# Patient Record
Sex: Female | Born: 1935 | Race: White | Hispanic: No | Smoking: Never smoker
Health system: Southern US, Community
[De-identification: ages and names within clinical notes are randomized; demographics above are authoritative.]

## PROBLEM LIST (undated history)

## (undated) DIAGNOSIS — E079 Disorder of thyroid, unspecified: Secondary | ICD-10-CM

## (undated) DIAGNOSIS — F0390 Unspecified dementia without behavioral disturbance: Secondary | ICD-10-CM

---

## 2015-12-24 ENCOUNTER — Encounter (HOSPITAL_BASED_OUTPATIENT_CLINIC_OR_DEPARTMENT_OTHER): Payer: Self-pay | Admitting: Emergency Medicine

## 2015-12-24 ENCOUNTER — Inpatient Hospital Stay (HOSPITAL_BASED_OUTPATIENT_CLINIC_OR_DEPARTMENT_OTHER)
Admission: EM | Admit: 2015-12-24 | Discharge: 2015-12-27 | DRG: 200 | Disposition: A | Discharge status: Home Health | Payer: Medicare Other | Attending: Surgery | Admitting: Surgery

## 2015-12-24 ENCOUNTER — Emergency Department (HOSPITAL_BASED_OUTPATIENT_CLINIC_OR_DEPARTMENT_OTHER): Payer: Medicare Other

## 2015-12-24 DIAGNOSIS — E079 Disorder of thyroid, unspecified: Secondary | ICD-10-CM | POA: Diagnosis present

## 2015-12-24 DIAGNOSIS — J939 Pneumothorax, unspecified: Secondary | ICD-10-CM

## 2015-12-24 DIAGNOSIS — J90 Pleural effusion, not elsewhere classified: Secondary | ICD-10-CM | POA: Diagnosis present

## 2015-12-24 DIAGNOSIS — J9811 Atelectasis: Secondary | ICD-10-CM | POA: Diagnosis present

## 2015-12-24 DIAGNOSIS — S2249XA Multiple fractures of ribs, unspecified side, initial encounter for closed fracture: Secondary | ICD-10-CM | POA: Diagnosis present

## 2015-12-24 DIAGNOSIS — J948 Other specified pleural conditions: Secondary | ICD-10-CM

## 2015-12-24 DIAGNOSIS — S272XXA Traumatic hemopneumothorax, initial encounter: Principal | ICD-10-CM

## 2015-12-24 DIAGNOSIS — S2242XA Multiple fractures of ribs, left side, initial encounter for closed fracture: Secondary | ICD-10-CM

## 2015-12-24 DIAGNOSIS — F0390 Unspecified dementia without behavioral disturbance: Secondary | ICD-10-CM | POA: Diagnosis present

## 2015-12-24 DIAGNOSIS — R079 Chest pain, unspecified: Secondary | ICD-10-CM | POA: Diagnosis present

## 2015-12-24 DIAGNOSIS — W19XXXA Unspecified fall, initial encounter: Secondary | ICD-10-CM

## 2015-12-24 HISTORY — DX: Disorder of thyroid, unspecified: E07.9

## 2015-12-24 HISTORY — DX: Unspecified dementia, unspecified severity, without behavioral disturbance, psychotic disturbance, mood disturbance, and anxiety: F03.90

## 2015-12-24 LAB — CBC WITH DIFFERENTIAL/PLATELET
Basophils Absolute: 0.1 10*3/uL (ref 0.0–0.1)
Basophils Relative: 1 %
EOS ABS: 0.5 10*3/uL (ref 0.0–0.7)
EOS PCT: 4 %
HCT: 35 % — ABNORMAL LOW (ref 36.0–46.0)
Hemoglobin: 12 g/dL (ref 12.0–15.0)
LYMPHS ABS: 2.7 10*3/uL (ref 0.7–4.0)
LYMPHS PCT: 25 %
MCH: 29.4 pg (ref 26.0–34.0)
MCHC: 34.3 g/dL (ref 30.0–36.0)
MCV: 85.8 fL (ref 78.0–100.0)
MONOS PCT: 12 %
Monocytes Absolute: 1.3 10*3/uL — ABNORMAL HIGH (ref 0.1–1.0)
Neutro Abs: 6.1 10*3/uL (ref 1.7–7.7)
Neutrophils Relative %: 58 %
PLATELETS: 229 10*3/uL (ref 150–400)
RBC: 4.08 MIL/uL (ref 3.87–5.11)
RDW: 13.1 % (ref 11.5–15.5)
WBC: 10.6 10*3/uL — AB (ref 4.0–10.5)

## 2015-12-24 LAB — COMPREHENSIVE METABOLIC PANEL
ALK PHOS: 49 U/L (ref 38–126)
ALT: 19 U/L (ref 14–54)
ANION GAP: 9 (ref 5–15)
AST: 31 U/L (ref 15–41)
Albumin: 3.2 g/dL — ABNORMAL LOW (ref 3.5–5.0)
BUN: 25 mg/dL — ABNORMAL HIGH (ref 6–20)
CALCIUM: 8.5 mg/dL — AB (ref 8.9–10.3)
CHLORIDE: 96 mmol/L — AB (ref 101–111)
CO2: 25 mmol/L (ref 22–32)
CREATININE: 0.96 mg/dL (ref 0.44–1.00)
GFR, EST AFRICAN AMERICAN: 56 mL/min — AB (ref >60)
GFR, EST NON AFRICAN AMERICAN: 48 mL/min — AB (ref >60)
Glucose, Bld: 92 mg/dL (ref 65–99)
Potassium: 4.1 mmol/L (ref 3.5–5.1)
Sodium: 130 mmol/L — ABNORMAL LOW (ref 135–145)
Total Bilirubin: 0.5 mg/dL (ref 0.3–1.2)
Total Protein: 6.1 g/dL — ABNORMAL LOW (ref 6.5–8.1)

## 2015-12-24 LAB — URINALYSIS, ROUTINE W REFLEX MICROSCOPIC
BILIRUBIN URINE: NEGATIVE
Glucose, UA: NEGATIVE mg/dL
Hgb urine dipstick: NEGATIVE
KETONES UR: NEGATIVE mg/dL
LEUKOCYTES UA: NEGATIVE
NITRITE: NEGATIVE
Protein, ur: NEGATIVE mg/dL
Specific Gravity, Urine: >1.046 — ABNORMAL HIGH (ref 1.005–1.030)
pH: 5.5 (ref 5.0–8.0)

## 2015-12-24 MED ORDER — HYDROCODONE-ACETAMINOPHEN 5-325 MG PO TABS: 0.5000 | ORAL_TABLET | ORAL | Status: DC | PRN

## 2015-12-24 MED ORDER — MORPHINE SULFATE (PF) 2 MG/ML IV SOLN: 1.0000 mg | INTRAVENOUS | Status: DC | PRN

## 2015-12-24 MED ORDER — HYDROCODONE-ACETAMINOPHEN 5-325 MG PO TABS: 2.0000 | ORAL_TABLET | ORAL | Status: DC | PRN

## 2015-12-24 MED ORDER — IOPAMIDOL (ISOVUE-300) INJECTION 61%
80.0000 mL | Freq: Once | INTRAVENOUS | Status: AC | PRN
  Administered 2015-12-24: 100 mL via INTRAVENOUS

## 2015-12-24 MED ORDER — SODIUM CHLORIDE 0.9 % IV SOLN
INTRAVENOUS | Status: DC
  Administered 2015-12-24: 23:00:00 via INTRAVENOUS

## 2015-12-24 MED ORDER — MORPHINE SULFATE (PF) 4 MG/ML IV SOLN: 3.0000 mg | INTRAVENOUS | Status: DC | PRN

## 2015-12-24 MED ORDER — MORPHINE SULFATE (PF) 2 MG/ML IV SOLN: 0.5000 mg | INTRAVENOUS | Status: DC | PRN

## 2015-12-24 MED ORDER — ACETAMINOPHEN 500 MG PO TABS
1000.0000 mg | ORAL_TABLET | Freq: Once | ORAL | Status: AC
  Administered 2015-12-24: 1000 mg via ORAL
  Filled 2015-12-24: qty 2

## 2015-12-24 MED ORDER — FENTANYL CITRATE (PF) 100 MCG/2ML IJ SOLN
50.0000 ug | Freq: Once | INTRAMUSCULAR | Status: AC
  Administered 2015-12-24: 50 ug via INTRAVENOUS
  Filled 2015-12-24: qty 2

## 2015-12-24 MED ORDER — ONDANSETRON HCL 4 MG/2ML IJ SOLN: 4.0000 mg | Freq: Four times a day (QID) | INTRAMUSCULAR | Status: DC | PRN

## 2015-12-24 MED ORDER — HYDROCODONE-ACETAMINOPHEN 5-325 MG PO TABS
1.0000 | ORAL_TABLET | ORAL | Status: DC | PRN
  Administered 2015-12-25: 1 via ORAL
  Filled 2015-12-24: qty 1

## 2015-12-24 MED ORDER — ENOXAPARIN SODIUM 30 MG/0.3ML ~~LOC~~ SOLN: 30.0000 mg | SUBCUTANEOUS | Status: DC

## 2015-12-24 MED ORDER — ONDANSETRON HCL 4 MG/2ML IJ SOLN
4.0000 mg | Freq: Once | INTRAMUSCULAR | Status: AC
  Administered 2015-12-24: 4 mg via INTRAVENOUS
  Filled 2015-12-24: qty 2

## 2015-12-24 MED ORDER — MORPHINE SULFATE (PF) 2 MG/ML IV SOLN
2.0000 mg | INTRAVENOUS | Status: DC | PRN
  Administered 2015-12-24 – 2015-12-25 (×3): 2 mg via INTRAVENOUS
  Filled 2015-12-24 (×3): qty 1

## 2015-12-24 MED ORDER — ONDANSETRON HCL 4 MG PO TABS: 4.0000 mg | ORAL_TABLET | Freq: Four times a day (QID) | ORAL | Status: DC | PRN

## 2015-12-24 NOTE — H&P (Signed)
History   Ashlee Gomez is an 82 y.o. female.   Chief Complaint:  Chief Complaint  Patient presents with  . Fall    Fall  Associated symptoms include chest pain. Pertinent negatives include no abdominal pain, chills, congestion, coughing, diaphoresis, fever, nausea, neck pain or vomiting.  This is a 82 year old female transferred from [facility]. She presented there with chest pain. She apparently fell approximate 3 days ago.  She does not room following. She lives alone and may have some mild dementia. Her son is with her currently. She denies shortness of breath. She reports that her pain is left-sided and toward the back and shoulder. She denies fever or chills. She denies abdominal pain. She was transferred here after a chest x-ray and CT scan showed a left-sided pneumothorax with multiple rib fractures  Past Medical History:  Diagnosis Date  . Dementia   . Thyroid disease     History reviewed. No pertinent surgical history.  History reviewed. No pertinent family history. Social History:  reports that she has never smoked. She has never used smokeless tobacco. She reports that she does not drink alcohol or use drugs.  Allergies  No Known Allergies  Home Medications   (Not in a hospital admission)  Trauma Course   Results for orders placed or performed during the hospital encounter of 12/24/15 (from the past 48 hour(s))  CBC with Differential     Status: Abnormal   Collection Time: 12/24/15  5:50 PM  Result Value Ref Range   WBC 10.6 (H) 4.0 - 10.5 K/uL   RBC 4.08 3.87 - 5.11 MIL/uL   Hemoglobin 12.0 12.0 - 15.0 g/dL   HCT 35.0 (L) 36.0 - 46.0 %   MCV 85.8 78.0 - 100.0 fL   MCH 29.4 26.0 - 34.0 pg   MCHC 34.3 30.0 - 36.0 g/dL   RDW 13.1 11.5 - 15.5 %   Platelets 229 150 - 400 K/uL   Neutrophils Relative % 58 %   Neutro Abs 6.1 1.7 - 7.7 K/uL   Lymphocytes Relative 25 %   Lymphs Abs 2.7 0.7 - 4.0 K/uL   Monocytes Relative 12 %   Monocytes Absolute 1.3  (H) 0.1 - 1.0 K/uL   Eosinophils Relative 4 %   Eosinophils Absolute 0.5 0.0 - 0.7 K/uL   Basophils Relative 1 %   Basophils Absolute 0.1 0.0 - 0.1 K/uL  Comprehensive metabolic panel     Status: Abnormal   Collection Time: 12/24/15  5:50 PM  Result Value Ref Range   Sodium 130 (L) 135 - 145 mmol/L   Potassium 4.1 3.5 - 5.1 mmol/L   Chloride 96 (L) 101 - 111 mmol/L   CO2 25 22 - 32 mmol/L   Glucose, Bld 92 65 - 99 mg/dL   BUN 25 (H) 6 - 20 mg/dL   Creatinine, Ser 0.96 0.44 - 1.00 mg/dL   Calcium 8.5 (L) 8.9 - 10.3 mg/dL   Total Protein 6.1 (L) 6.5 - 8.1 g/dL   Albumin 3.2 (L) 3.5 - 5.0 g/dL   AST 31 15 - 41 U/L   ALT 19 14 - 54 U/L   Alkaline Phosphatase 49 38 - 126 U/L   Total Bilirubin 0.5 0.3 - 1.2 mg/dL   GFR calc non Af Amer 48 (L) >60 mL/min   GFR calc Af Amer 56 (L) >60 mL/min    Comment: (NOTE) The eGFR has been calculated using the CKD EPI equation. This calculation has not been validated  in all clinical situations. eGFR's persistently <60 mL/min signify possible Chronic Kidney Disease.    Anion gap 9 5 - 15   Dg Ribs Unilateral W/chest Left  Result Date: 12/24/2015 CLINICAL DATA:  Fall with left rib pain and shortness of breath for 3 days. EXAM: LEFT RIBS AND CHEST - 3+ VIEW COMPARISON:  None. FINDINGS: Acute fractures of the left posterior fifth, sixth, seventh, eighth and ninth ribs noted. There is a left hydro pneumothorax noted, approximately 15% pneumothorax component atypically and a small to moderate hydropneumothorax component at the base. Left basilar atelectasis is noted. The right lung is clear. Cardiomediastinal silhouette is within normal limits. IMPRESSION: Small to moderate left hydropneumothorax caused by acute fractures of the left fifth through ninth ribs. Associated left basilar atelectasis. Critical Value/emergent results were called by telephone at the time of interpretation on 12/24/2015 at 5:17 pm to Domenic Moras, PA, , who verbally acknowledged these  results. Electronically Signed   By: Margarette Canada M.D.   On: 12/24/2015 17:17   Ct Head Wo Contrast  Result Date: 12/24/2015 CLINICAL DATA:  Fall 5 days ago.  History of dementia. EXAM: CT HEAD WITHOUT CONTRAST CT CERVICAL SPINE WITHOUT CONTRAST TECHNIQUE: Multidetector CT imaging of the head and cervical spine was performed following the standard protocol without intravenous contrast. Multiplanar CT image reconstructions of the cervical spine were also generated. COMPARISON:  None. FINDINGS: CT HEAD FINDINGS Brain: No subdural, epidural, or subarachnoid hemorrhage. Cerebellum, brainstem, and basal cisterns are normal. Ventricles and sulci are prominent but normal for age. Scattered white matter changes are identified. No acute cortical ischemia or infarct. No mass, mass effect, or midline shift. Vascular: Calcified atherosclerosis in the intracranial carotid arteries. Skull: Normal. Negative for fracture or focal lesion. Sinuses/Orbits: No acute finding. Other: None. CT CERVICAL SPINE FINDINGS Alignment: Minimal anterior listhesis of C6 versus C7, likely degenerative, with no associated fracture. No traumatic malalignment identified. Skull base and vertebrae: The anterior arch of C1 is not completely fused but the edges are well corticated and this is consistent with previous trauma or a developmental variant. No adjacent soft tissue swelling. There is a lucency through the right pedicle of C1 which is well corticated, also chronic in appearance. This is consistent with incomplete fusion versus remote history of trauma. No acute fractures are seen. Soft tissues and spinal canal: No prevertebral fluid or swelling. No visible canal hematoma. Disc levels: Multilevel degenerative changes with anterior osteophytes. No significant canal narrowing. Facet degenerative changes are noted. Upper chest: There is a hydro pneumothorax in the left apex. Elliptical high attenuation at the left apex of the collapse lung is  likely either atelectasis or contusion. Recommend attention on follow-up. Scarring at the right apex. Other: No other abnormalities. IMPRESSION: 1. No acute intracranial abnormality. 2. Left apical hydro pneumothorax. Elliptical opacity in the adjacent lung could be atelectasis, scar, or contusion. Recommend short-term follow-up. 3. No acute cervical spine fracture or traumatic malalignment. Incomplete fusion of the anterior arch of C1 and the right pedicle C1 are chronic in appearance consistent with prior trauma or a developmental anomaly. Electronically Signed   By: Dorise Bullion III M.D   On: 12/24/2015 19:09   Ct Chest W Contrast  Result Date: 12/24/2015 CLINICAL DATA:  Fall 5 days prior. Multiple left rib fractures with left pneumothorax on chest radiographs from earlier today. EXAM: CT CHEST WITH CONTRAST TECHNIQUE: Multidetector CT imaging of the chest was performed during intravenous contrast administration. CONTRAST:  149m ISOVUE-300 IOPAMIDOL (ISOVUE-300)  INJECTION 61% COMPARISON:  Chest radiograph from earlier today. FINDINGS: Cardiovascular: Normal heart size. No significant pericardial fluid/thickening. Atherosclerotic nonaneurysmal thoracic aorta. Normal caliber pulmonary arteries. No evidence of acute thoracic aortic injury. No central pulmonary emboli. Mediastinum/Nodes: Tiny focus of gas in the right posterior paratracheal mediastinum at the level of the thoracic inlet (series 3/ image 21), which could represent pneumomediastinum versus a tiny tracheal diverticulum. No mediastinal hematoma. Bilateral hypodense thyroid nodules, largest 0.8 cm in the anterior left thyroid lobe. Unremarkable esophagus. No axillary, mediastinal or hilar lymphadenopathy. Lungs/Pleura: No right pneumothorax. No right pleural effusion. Moderate left hydropneumothorax with approximately 30-40% pneumothorax component and small layering pleural effusion component. Mild right deviated trachea. Near complete left lower  lobe and lingular atelectasis. Patchy tree-in-bud opacities in the dependent and anterior basilar right upper lobe, inferior right middle lobe and dependent right lower lobe. No significant pulmonary nodules in the aerated portions of the lungs. Upper abdomen: Coarse curvilinear calcifications in the right adrenal gland, which could be from prior granulomatous disease or from remote right adrenal hemorrhage. No discrete adrenal nodules. Simple 1.5 cm renal cyst in the posterior upper left kidney. Additional subcentimeter hypodense renal cortical lesions in the upper kidneys bilaterally are too small to characterize and require no further follow-up. Mild diffuse pancreatic duct dilation in the visualized pancreas. Musculoskeletal: No aggressive appearing focal osseous lesions. Re- demonstrated are mildly displaced posterior left fifth through ninth rib fractures. Moderate thoracic spondylosis. IMPRESSION: 1. Re- demonstration of mildly displaced posterior left fifth through ninth rib fractures. 2. Re- demonstration of moderate left hydropneumothorax, not appreciably changed in size since the chest radiograph from earlier today, with 30-40% pneumothorax component and small layering pleural effusion component, with mild right deviation of the trachea, cannot exclude a tension pneumothorax. 3. Aortic atherosclerosis.  No acute aortic injury. 4. Near complete lingular and left lower lobe atelectasis. 5. Patchy tree-in-bud opacities in the right lung, indicating a mild nonspecific infectious or inflammatory bronchiolitis, with the differential including aspiration. Electronically Signed   By: Ilona Sorrel M.D.   On: 12/24/2015 19:11   Ct Cervical Spine Wo Contrast  Result Date: 12/24/2015 CLINICAL DATA:  Fall 5 days ago.  History of dementia. EXAM: CT HEAD WITHOUT CONTRAST CT CERVICAL SPINE WITHOUT CONTRAST TECHNIQUE: Multidetector CT imaging of the head and cervical spine was performed following the standard  protocol without intravenous contrast. Multiplanar CT image reconstructions of the cervical spine were also generated. COMPARISON:  None. FINDINGS: CT HEAD FINDINGS Brain: No subdural, epidural, or subarachnoid hemorrhage. Cerebellum, brainstem, and basal cisterns are normal. Ventricles and sulci are prominent but normal for age. Scattered white matter changes are identified. No acute cortical ischemia or infarct. No mass, mass effect, or midline shift. Vascular: Calcified atherosclerosis in the intracranial carotid arteries. Skull: Normal. Negative for fracture or focal lesion. Sinuses/Orbits: No acute finding. Other: None. CT CERVICAL SPINE FINDINGS Alignment: Minimal anterior listhesis of C6 versus C7, likely degenerative, with no associated fracture. No traumatic malalignment identified. Skull base and vertebrae: The anterior arch of C1 is not completely fused but the edges are well corticated and this is consistent with previous trauma or a developmental variant. No adjacent soft tissue swelling. There is a lucency through the right pedicle of C1 which is well corticated, also chronic in appearance. This is consistent with incomplete fusion versus remote history of trauma. No acute fractures are seen. Soft tissues and spinal canal: No prevertebral fluid or swelling. No visible canal hematoma. Disc levels: Multilevel degenerative changes  with anterior osteophytes. No significant canal narrowing. Facet degenerative changes are noted. Upper chest: There is a hydro pneumothorax in the left apex. Elliptical high attenuation at the left apex of the collapse lung is likely either atelectasis or contusion. Recommend attention on follow-up. Scarring at the right apex. Other: No other abnormalities. IMPRESSION: 1. No acute intracranial abnormality. 2. Left apical hydro pneumothorax. Elliptical opacity in the adjacent lung could be atelectasis, scar, or contusion. Recommend short-term follow-up. 3. No acute cervical spine  fracture or traumatic malalignment. Incomplete fusion of the anterior arch of C1 and the right pedicle C1 are chronic in appearance consistent with prior trauma or a developmental anomaly. Electronically Signed   By: Dorise Bullion III M.D   On: 12/24/2015 19:09    Review of Systems  Constitutional: Negative for chills, diaphoresis and fever.  HENT: Negative for congestion.   Eyes: Negative for blurred vision.  Respiratory: Negative for cough, hemoptysis, shortness of breath and stridor.   Cardiovascular: Positive for chest pain. Negative for palpitations and leg swelling.  Gastrointestinal: Negative for abdominal pain, nausea and vomiting.  Musculoskeletal: Negative for neck pain.  Neurological: Negative for dizziness.    Blood pressure 130/74, pulse 77, temperature 97.4 F (36.3 C), temperature source Oral, resp. rate 18, height 4' 11"  (1.499 m), weight 45.4 kg (100 lb), SpO2 100 %. Physical Exam  Constitutional: She is oriented to person, place, and time. She appears well-developed and well-nourished. No distress.  HENT:  Head: Normocephalic and atraumatic.  Right Ear: External ear normal.  Left Ear: External ear normal.  Nose: Nose normal.  Mouth/Throat: Oropharynx is clear and moist. No oropharyngeal exudate.  Eyes: Conjunctivae are normal. Pupils are equal, round, and reactive to light. Right eye exhibits no discharge. Left eye exhibits no discharge. No scleral icterus.  Neck: Normal range of motion. No tracheal deviation present.  Cardiovascular: Normal rate, regular rhythm, normal heart sounds and intact distal pulses.   No murmur heard. Respiratory: Effort normal and breath sounds normal. No respiratory distress. She has no wheezes. She exhibits tenderness.  There is left posterior chest wall tenderness. Lungs are clear bilaterally with a mild decrease in the left base  GI: Soft. She exhibits no distension. There is no tenderness. There is no guarding.  Musculoskeletal:  Normal range of motion. She exhibits no edema, tenderness or deformity.  Lymphadenopathy:    She has no cervical adenopathy.  Neurological: She is alert and oriented to person, place, and time.  Skin: Skin is warm and dry. She is not diaphoretic. No erythema.  Psychiatric: She has a normal mood and affect. Her behavior is normal.     Assessment/Plan Elderly female status post fall with multiple left rib fractures and a 30-40% hydropneumothorax.  I have had a long discussion with the patient and her son. She will be admitted to the stepdown unit for close observation regarding her pulmonary status and aggressive pulmonary toilet and pain control.  I will hold on chest tube placement tonight as she is not in any acute distress and the injury occurred several days ago. I will discuss this with the rest of the trauma service in the morning. She may benefit from proceeding conservatively with a small tube placed by interventional radiology and a thoracentesis to see if this is blood in the chest or a sympathetic effusion.  If she decompensates, I will place a chest tube emergently. I explained to the son her risk of pneumonia in her significant morbidity given her age.  Ociel Retherford A 12/24/2015, 9:22 PM   Procedures

## 2015-12-24 NOTE — ED Notes (Signed)
Called [facility] and advised that [facility]

## 2015-12-24 NOTE — ED Notes (Signed)
Family at bedside. 

## 2015-12-24 NOTE — ED Provider Notes (Signed)
[facility] Provider Note   CSN: [id number] Arrival date & time: 12/24/15  7829  By signing my name below, I, Ashlee Gomez, attest that this documentation has been prepared under the direction and in the presence of Alvira Monday, MD.  Electronically Signed: Octavia Heir, ED Scribe. 12/24/15. 4:41 PM.    History   Chief Complaint Chief Complaint  Patient presents with  . Fall    The history is provided by the patient. No language interpreter was used.   HPI Comments: Ashlee Gomez is a 82 y.o. female who mild dementia and thyroid disease presents to the Emergency Department s/p a fall that occurred five days ago. She reports associated 9/10 left rib pain, mid back pain, flank pain, and acute onset bilateral leg swelling. She says movement, deep breaths, and exertion makes her pain worse. Son notes laying down flat is very painful to her. He is also concerned about drainage coming from pt's right eye. Pt lives at home by herself and has a neighbor who comes to check on her. Son believes that pt fell 5 days ago due to having bruising and tenderness to her left ribs. Pt states she does not remember falling at all. She has applying a Lidoderm patch and taken two advil to alleviate her pain with temporary relief. Denies SOB, cough, fever, loss of consciousness, dizziness, rhinorrhea, abdominal pain, hip pain, neck pain, headache, numbness, weakness, dysuria, difficulty urinating. Pt is not on any anticoagulants.  Past Medical History:  Diagnosis Date  . Dementia   . Thyroid disease     Patient Active Problem List   Diagnosis Date Noted  . Multiple rib fractures 12/24/2015    History reviewed. No pertinent surgical history.  OB History    No data available       Home Medications    Prior to Admission medications   Medication Sig Start Date End Date Taking? Authorizing Provider  desonide (DESOWEN) 0.05 % ointment Apply 1 application topically 2 (two) times  daily.   Yes Historical Provider, MD  glucosamine-chondroitin 500-400 MG tablet Take 1 tablet by mouth daily.   Yes Historical Provider, MD  ibuprofen (ADVIL,MOTRIN) 200 MG tablet Take 200-400 mg by mouth every 6 (six) hours as needed.    Yes Historical Provider, MD  Multiple Vitamin (MULTIVITAMIN WITH MINERALS) TABS tablet Take 1 tablet by mouth daily.   Yes Historical Provider, MD  Omega-3 Fatty Acids (FISH OIL) 1000 MG CAPS Take 1,000 mg by mouth daily.   Yes Historical Provider, MD  SYNTHROID 50 MCG tablet Take 50 mcg by mouth daily before breakfast. 10/11/15  Yes Historical Provider, MD    Family History History reviewed. No pertinent family history.  Social History Social History  Substance Use Topics  . Smoking status: Never Smoker  . Smokeless tobacco: Never Used  . Alcohol use No     Allergies   Patient has no known allergies.   Review of Systems Review of Systems  Constitutional: Negative for fever.  HENT: Negative for rhinorrhea.   Respiratory: Negative for cough and shortness of breath.   Cardiovascular: Positive for leg swelling. Negative for chest pain.  Gastrointestinal: Positive for abdominal pain.  Genitourinary: Positive for flank pain. Negative for difficulty urinating, dysuria and hematuria.  Musculoskeletal: Positive for back pain and myalgias. Negative for neck pain.  Neurological: Negative for dizziness, syncope, weakness, light-headedness, numbness and headaches.     Physical Exam Updated Vital Signs BP 122/57   Pulse 66   Temp  97.8 F (36.6 C) (Oral)   Resp 23   Ht 4\' 11"  (1.499 m)   Wt 100 lb 15.5 oz (45.8 kg)   SpO2 98%   BMI 20.39 kg/m   Physical Exam  Constitutional: She is oriented to person, place, and time. She appears well-developed and well-nourished. No distress.  HENT:  Head: Normocephalic and atraumatic.  Eyes: Conjunctivae and EOM are normal.  Neck: Normal range of motion.  Cardiovascular: Normal rate, regular rhythm, normal  heart sounds and intact distal pulses.  Exam reveals no gallop and no friction rub.   No murmur heard. Pulmonary/Chest: Effort normal and breath sounds normal. No respiratory distress. She has no wheezes. She has no rales. She exhibits tenderness.  Abdominal: Soft. She exhibits no distension. There is no tenderness. There is no guarding.  Musculoskeletal: Normal range of motion. She exhibits edema (bilat lower legs) and tenderness.  Midline thoracic tenderness and tenderness to the left side of her back, contusions over her left flank overlying her ribs  Neurological: She is alert and oriented to person, place, and time.  Skin: Skin is warm and dry. No rash noted. She is not diaphoretic. No erythema.  Psychiatric: She has a normal mood and affect.  Nursing note and vitals reviewed.    ED Treatments / Results  DIAGNOSTIC STUDIES: Oxygen Saturation is 96% on RA, adequate by my interpretation.  COORDINATION OF CARE:  4:37 PM Discussed treatment plan with pt at bedside and pt agreed to plan.  Labs (all labs ordered are listed, but only abnormal results are displayed) Labs Reviewed  CBC WITH DIFFERENTIAL/PLATELET - Abnormal; Notable for the following:       Result Value   WBC 10.6 (*)    HCT 35.0 (*)    Monocytes Absolute 1.3 (*)    All other components within normal limits  COMPREHENSIVE METABOLIC PANEL - Abnormal; Notable for the following:    Sodium 130 (*)    Chloride 96 (*)    BUN 25 (*)    Calcium 8.5 (*)    Total Protein 6.1 (*)    Albumin 3.2 (*)    GFR calc non Af Amer 48 (*)    GFR calc Af Amer 56 (*)    All other components within normal limits  URINALYSIS, ROUTINE W REFLEX MICROSCOPIC (NOT AT [facility]) - Abnormal; Notable for the following:    Specific Gravity, Urine >1.046 (*)    All other components within normal limits  MRSA PCR SCREENING  CBC  BASIC METABOLIC PANEL    EKG  EKG Interpretation None       Radiology Dg Ribs Unilateral W/chest Left  Result  Date: 12/24/2015 CLINICAL DATA:  Fall with left rib pain and shortness of breath for 3 days. EXAM: LEFT RIBS AND CHEST - 3+ VIEW COMPARISON:  None. FINDINGS: Acute fractures of the left posterior fifth, sixth, seventh, eighth and ninth ribs noted. There is a left hydro pneumothorax noted, approximately 15% pneumothorax component atypically and a small to moderate hydropneumothorax component at the base. Left basilar atelectasis is noted. The right lung is clear. Cardiomediastinal silhouette is within normal limits. IMPRESSION: Small to moderate left hydropneumothorax caused by acute fractures of the left fifth through ninth ribs. Associated left basilar atelectasis. Critical Value/emergent results were called by telephone at the time of interpretation on 12/24/2015 at 5:17 pm to Fayrene HelperBowie Tran, PA, , who verbally acknowledged these results. Electronically Signed   By: Harmon PierJeffrey  Hu M.D.   On: 12/24/2015 17:17  Ct Head Wo Contrast  Result Date: 12/24/2015 CLINICAL DATA:  Fall 5 days ago.  History of dementia. EXAM: CT HEAD WITHOUT CONTRAST CT CERVICAL SPINE WITHOUT CONTRAST TECHNIQUE: Multidetector CT imaging of the head and cervical spine was performed following the standard protocol without intravenous contrast. Multiplanar CT image reconstructions of the cervical spine were also generated. COMPARISON:  None. FINDINGS: CT HEAD FINDINGS Brain: No subdural, epidural, or subarachnoid hemorrhage. Cerebellum, brainstem, and basal cisterns are normal. Ventricles and sulci are prominent but normal for age. Scattered white matter changes are identified. No acute cortical ischemia or infarct. No mass, mass effect, or midline shift. Vascular: Calcified atherosclerosis in the intracranial carotid arteries. Skull: Normal. Negative for fracture or focal lesion. Sinuses/Orbits: No acute finding. Other: None. CT CERVICAL SPINE FINDINGS Alignment: Minimal anterior listhesis of C6 versus C7, likely degenerative, with no associated  fracture. No traumatic malalignment identified. Skull base and vertebrae: The anterior arch of C1 is not completely fused but the edges are well corticated and this is consistent with previous trauma or a developmental variant. No adjacent soft tissue swelling. There is a lucency through the right pedicle of C1 which is well corticated, also chronic in appearance. This is consistent with incomplete fusion versus remote history of trauma. No acute fractures are seen. Soft tissues and spinal canal: No prevertebral fluid or swelling. No visible canal hematoma. Disc levels: Multilevel degenerative changes with anterior osteophytes. No significant canal narrowing. Facet degenerative changes are noted. Upper chest: There is a hydro pneumothorax in the left apex. Elliptical high attenuation at the left apex of the collapse lung is likely either atelectasis or contusion. Recommend attention on follow-up. Scarring at the right apex. Other: No other abnormalities. IMPRESSION: 1. No acute intracranial abnormality. 2. Left apical hydro pneumothorax. Elliptical opacity in the adjacent lung could be atelectasis, scar, or contusion. Recommend short-term follow-up. 3. No acute cervical spine fracture or traumatic malalignment. Incomplete fusion of the anterior arch of C1 and the right pedicle C1 are chronic in appearance consistent with prior trauma or a developmental anomaly. Electronically Signed   By: Gerome Samavid  Williams III M.D   On: 12/24/2015 19:09   Ct Chest W Contrast  Result Date: 12/24/2015 CLINICAL DATA:  Fall 5 days prior. Multiple left rib fractures with left pneumothorax on chest radiographs from earlier today. EXAM: CT CHEST WITH CONTRAST TECHNIQUE: Multidetector CT imaging of the chest was performed during intravenous contrast administration. CONTRAST:  100mL ISOVUE-300 IOPAMIDOL (ISOVUE-300) INJECTION 61% COMPARISON:  Chest radiograph from earlier today. FINDINGS: Cardiovascular: Normal heart size. No significant  pericardial fluid/thickening. Atherosclerotic nonaneurysmal thoracic aorta. Normal caliber pulmonary arteries. No evidence of acute thoracic aortic injury. No central pulmonary emboli. Mediastinum/Nodes: Tiny focus of gas in the right posterior paratracheal mediastinum at the level of the thoracic inlet (series 3/ image 21), which could represent pneumomediastinum versus a tiny tracheal diverticulum. No mediastinal hematoma. Bilateral hypodense thyroid nodules, largest 0.8 cm in the anterior left thyroid lobe. Unremarkable esophagus. No axillary, mediastinal or hilar lymphadenopathy. Lungs/Pleura: No right pneumothorax. No right pleural effusion. Moderate left hydropneumothorax with approximately 30-40% pneumothorax component and small layering pleural effusion component. Mild right deviated trachea. Near complete left lower lobe and lingular atelectasis. Patchy tree-in-bud opacities in the dependent and anterior basilar right upper lobe, inferior right middle lobe and dependent right lower lobe. No significant pulmonary nodules in the aerated portions of the lungs. Upper abdomen: Coarse curvilinear calcifications in the right adrenal gland, which could be from prior granulomatous disease or  from remote right adrenal hemorrhage. No discrete adrenal nodules. Simple 1.5 cm renal cyst in the posterior upper left kidney. Additional subcentimeter hypodense renal cortical lesions in the upper kidneys bilaterally are too small to characterize and require no further follow-up. Mild diffuse pancreatic duct dilation in the visualized pancreas. Musculoskeletal: No aggressive appearing focal osseous lesions. Re- demonstrated are mildly displaced posterior left fifth through ninth rib fractures. Moderate thoracic spondylosis. IMPRESSION: 1. Re- demonstration of mildly displaced posterior left fifth through ninth rib fractures. 2. Re- demonstration of moderate left hydropneumothorax, not appreciably changed in size since the  chest radiograph from earlier today, with 30-40% pneumothorax component and small layering pleural effusion component, with mild right deviation of the trachea, cannot exclude a tension pneumothorax. 3. Aortic atherosclerosis.  No acute aortic injury. 4. Near complete lingular and left lower lobe atelectasis. 5. Patchy tree-in-bud opacities in the right lung, indicating a mild nonspecific infectious or inflammatory bronchiolitis, with the differential including aspiration. Electronically Signed   By: Delbert Phenix M.D.   On: 12/24/2015 19:11   Ct Cervical Spine Wo Contrast  Result Date: 12/24/2015 CLINICAL DATA:  Fall 5 days ago.  History of dementia. EXAM: CT HEAD WITHOUT CONTRAST CT CERVICAL SPINE WITHOUT CONTRAST TECHNIQUE: Multidetector CT imaging of the head and cervical spine was performed following the standard protocol without intravenous contrast. Multiplanar CT image reconstructions of the cervical spine were also generated. COMPARISON:  None. FINDINGS: CT HEAD FINDINGS Brain: No subdural, epidural, or subarachnoid hemorrhage. Cerebellum, brainstem, and basal cisterns are normal. Ventricles and sulci are prominent but normal for age. Scattered white matter changes are identified. No acute cortical ischemia or infarct. No mass, mass effect, or midline shift. Vascular: Calcified atherosclerosis in the intracranial carotid arteries. Skull: Normal. Negative for fracture or focal lesion. Sinuses/Orbits: No acute finding. Other: None. CT CERVICAL SPINE FINDINGS Alignment: Minimal anterior listhesis of C6 versus C7, likely degenerative, with no associated fracture. No traumatic malalignment identified. Skull base and vertebrae: The anterior arch of C1 is not completely fused but the edges are well corticated and this is consistent with previous trauma or a developmental variant. No adjacent soft tissue swelling. There is a lucency through the right pedicle of C1 which is well corticated, also chronic in  appearance. This is consistent with incomplete fusion versus remote history of trauma. No acute fractures are seen. Soft tissues and spinal canal: No prevertebral fluid or swelling. No visible canal hematoma. Disc levels: Multilevel degenerative changes with anterior osteophytes. No significant canal narrowing. Facet degenerative changes are noted. Upper chest: There is a hydro pneumothorax in the left apex. Elliptical high attenuation at the left apex of the collapse lung is likely either atelectasis or contusion. Recommend attention on follow-up. Scarring at the right apex. Other: No other abnormalities. IMPRESSION: 1. No acute intracranial abnormality. 2. Left apical hydro pneumothorax. Elliptical opacity in the adjacent lung could be atelectasis, scar, or contusion. Recommend short-term follow-up. 3. No acute cervical spine fracture or traumatic malalignment. Incomplete fusion of the anterior arch of C1 and the right pedicle C1 are chronic in appearance consistent with prior trauma or a developmental anomaly. Electronically Signed   By: Gerome Sam III M.D   On: 12/24/2015 19:09   CRITICAL CARE: rib fx, pneumothorax, transfer to trauma facility Performed by: Rhae Lerner   Total critical care time: 30 minutes  Critical care time was exclusive of separately billable procedures and treating other patients.  Critical care was necessary to treat or prevent imminent  or life-threatening deterioration.  Critical care was time spent personally by me on the following activities: development of treatment plan with patient and/or surrogate as well as nursing, discussions with consultants, evaluation of patient's response to treatment, examination of patient, obtaining history from patient or surrogate, ordering and performing treatments and interventions, ordering and review of laboratory studies, ordering and review of radiographic studies, pulse oximetry and re-evaluation of patient's  condition.   Procedures Procedures (including critical care time)  Medications Ordered in ED Medications  enoxaparin (LOVENOX) injection 30 mg (not administered)  0.9 %  sodium chloride infusion ( Intravenous New Bag/Given 12/24/15 2300)  HYDROcodone-acetaminophen (NORCO/VICODIN) 5-325 MG per tablet 0.5 tablet (not administered)  HYDROcodone-acetaminophen (NORCO/VICODIN) 5-325 MG per tablet 1 tablet (1 tablet Oral Given 12/25/15 0009)  HYDROcodone-acetaminophen (NORCO/VICODIN) 5-325 MG per tablet 2 tablet (not administered)  morphine 2 MG/ML injection 0.5 mg (not administered)  morphine 2 MG/ML injection 1 mg (not administered)  morphine 2 MG/ML injection 2 mg (2 mg Intravenous Given 12/24/15 2313)  morphine 4 MG/ML injection 3 mg (not administered)  ondansetron (ZOFRAN) tablet 4 mg (not administered)    Or  ondansetron (ZOFRAN) injection 4 mg (not administered)  acetaminophen (TYLENOL) tablet 1,000 mg (1,000 mg Oral Given 12/24/15 1701)  fentaNYL (SUBLIMAZE) injection 50 mcg (50 mcg Intravenous Given 12/24/15 1810)  ondansetron (ZOFRAN) injection 4 mg (4 mg Intravenous Given 12/24/15 1810)  iopamidol (ISOVUE-300) 61 % injection 80 mL (100 mLs Intravenous Contrast Given 12/24/15 1830)     Initial Impression / Assessment and Plan / ED Course  I have reviewed the triage vital signs and the nursing notes.  Pertinent labs & imaging results that were available during my care of the patient were reviewed by me and considered in my medical decision making (see chart for details).  Clinical Course     82 year old female with a history of hypothyroidism presents with concern for left-sided rib pain after a possible fall on Wednesday. Patient denies any other pain or injuries. She does not recall the fall.  X-ray obtained shows amylase 5 rib fractures, pneumothorax and likely hemothorax. Patient is hemodynamically stable, is pulling 750 and spirometry.  CT head, CSpine without acute  abnormalities. CT chest confirms above.  Discussed goals of care with son. Discussed with Dr. Magnus Ivan of Trauma Surgery, will transfer to [facility] ED for evaluation and admission.  I personally performed the services described in this documentation, which was scribed in my presence. The recorded information has been reviewed and is accurate.  Final Clinical Impressions(s) / ED Diagnoses   Final diagnoses:  Fall  Closed fracture of multiple ribs of left side, initial encounter  Hydropneumothorax    New Prescriptions Current Discharge Medication List       Alvira Monday, MD 12/25/15 0221

## 2015-12-24 NOTE — ED Triage Notes (Signed)
Per son patient fell at home either Wednesday night or Thursday morning.  Unsure of events leading up to fall.  Patient states that she has pain in the left flank and upper left back.  Bruising noted to left flank.  Son states that this was noted to be a very small area in size yesterday but progressed to a very large area today.  Patient has two lidocaine patches noted on back.

## 2015-12-24 NOTE — ED Notes (Signed)
Per son thinks she may have fell on Wed., pain to left rib/side with bruising noted to left flank. Also concerned about redness to upper eye lid with some drainage . Denies SOB

## 2015-12-24 NOTE — ED Provider Notes (Signed)
[facility].  Patient had a fall on Wednesday. She was found to have multiple left rib fractures and a hematoma/pneumothorax. No other injuries identified. Patient is to be seen by Dr. Magnus IvanBlackman with trauma. She currently appears to be comfortable. Her oxygen saturations are 100%.   Rolan BuccoMelanie Deedra Pro, MD 12/24/15 2046

## 2015-12-24 NOTE — ED Notes (Signed)
Patient transported to CT 

## 2015-12-24 NOTE — ED Notes (Signed)
Nicky, RRT in to help pt with incentive spirometry

## 2015-12-25 ENCOUNTER — Inpatient Hospital Stay (HOSPITAL_COMMUNITY): Payer: Medicare Other

## 2015-12-25 DIAGNOSIS — S272XXA Traumatic hemopneumothorax, initial encounter: Secondary | ICD-10-CM | POA: Diagnosis present

## 2015-12-25 LAB — CBC
HEMATOCRIT: 33.5 % — AB (ref 36.0–46.0)
HEMOGLOBIN: 11.2 g/dL — AB (ref 12.0–15.0)
MCH: 28.4 pg (ref 26.0–34.0)
MCHC: 33.4 g/dL (ref 30.0–36.0)
MCV: 84.8 fL (ref 78.0–100.0)
Platelets: 222 10*3/uL (ref 150–400)
RBC: 3.95 MIL/uL (ref 3.87–5.11)
RDW: 13.2 % (ref 11.5–15.5)
WBC: 8.1 10*3/uL (ref 4.0–10.5)

## 2015-12-25 LAB — BASIC METABOLIC PANEL
Anion gap: 8 (ref 5–15)
BUN: 15 mg/dL (ref 6–20)
CHLORIDE: 102 mmol/L (ref 101–111)
CO2: 24 mmol/L (ref 22–32)
CREATININE: 0.89 mg/dL (ref 0.44–1.00)
Calcium: 8 mg/dL — ABNORMAL LOW (ref 8.9–10.3)
GFR calc non Af Amer: 53 mL/min — ABNORMAL LOW (ref >60)
Glucose, Bld: 80 mg/dL (ref 65–99)
POTASSIUM: 4 mmol/L (ref 3.5–5.1)
SODIUM: 134 mmol/L — AB (ref 135–145)

## 2015-12-25 LAB — MRSA PCR SCREENING: MRSA BY PCR: NEGATIVE

## 2015-12-25 MED ORDER — MORPHINE SULFATE (PF) 2 MG/ML IV SOLN
2.0000 mg | INTRAVENOUS | Status: DC | PRN
  Administered 2015-12-25 – 2015-12-26 (×4): 2 mg via INTRAVENOUS
  Filled 2015-12-25 (×4): qty 1

## 2015-12-25 MED ORDER — HYDROCORTISONE 1 % EX OINT
TOPICAL_OINTMENT | Freq: Two times a day (BID) | CUTANEOUS | Status: DC
  Administered 2015-12-25 – 2015-12-26 (×3): via TOPICAL
  Filled 2015-12-25 (×2): qty 28.35

## 2015-12-25 MED ORDER — LEVOTHYROXINE SODIUM 50 MCG PO TABS
50.0000 ug | ORAL_TABLET | Freq: Every day | ORAL | Status: DC
  Administered 2015-12-25 – 2015-12-27 (×3): 50 ug via ORAL
  Filled 2015-12-25 (×3): qty 1

## 2015-12-25 MED ORDER — WHITE PETROLATUM GEL
Status: AC
  Administered 2015-12-25: 11:00:00
  Filled 2015-12-25: qty 1

## 2015-12-25 MED ORDER — ENOXAPARIN SODIUM 30 MG/0.3ML ~~LOC~~ SOLN
30.0000 mg | SUBCUTANEOUS | Status: DC
  Administered 2015-12-26: 30 mg via SUBCUTANEOUS
  Filled 2015-12-25: qty 0.3

## 2015-12-25 MED ORDER — TRAMADOL HCL 50 MG PO TABS
50.0000 mg | ORAL_TABLET | Freq: Four times a day (QID) | ORAL | Status: DC | PRN
  Administered 2015-12-25 – 2015-12-27 (×6): 100 mg via ORAL
  Filled 2015-12-25 (×6): qty 2

## 2015-12-25 MED ORDER — LIDOCAINE 5 % EX PTCH
1.0000 | MEDICATED_PATCH | CUTANEOUS | Status: DC
  Administered 2015-12-25 – 2015-12-26 (×2): 1 via TRANSDERMAL
  Filled 2015-12-25 (×2): qty 1

## 2015-12-25 NOTE — Progress Notes (Signed)
Patient ID: Ashlee Gomez, female   DOB: Jan 17, 1917, 82 y.o.   MRN: [id number]   LOS: 1 day   Subjective: No unexpected c/o, chest sore. No SOB.   Objective: Vital signs in last 24 hours: Temp:  [97.4 F (36.3 C)-97.9 F (36.6 C)] 97.9 F (36.6 C) (11/20 0325) Pulse Rate:  [65-95] 95 (11/20 0600) Resp:  [11-25] 14 (11/20 0600) BP: (97-148)/(54-86) 141/62 (11/20 0600) SpO2:  [88 %-100 %] 95 % (11/20 0600) Weight:  [45.4 kg (100 lb)-45.8 kg (100 lb 15.5 oz)] 45.8 kg (100 lb 15.5 oz) (11/19 2154)    IS: 750ml   Laboratory  CBC  Recent Labs  12/24/15 1750 12/25/15 0336  WBC 10.6* 8.1  HGB 12.0 11.2*  HCT 35.0* 33.5*  PLT 229 222   BMET  Recent Labs  12/24/15 1750 12/25/15 0336  NA 130* 134*  K 4.1 4.0  CL 96* 102  CO2 25 24  GLUCOSE 92 80  BUN 25* 15  CREATININE 0.96 0.89  CALCIUM 8.5* 8.0*    Radiology Results PORTABLE CHEST 1 VIEW  COMPARISON:  Chest x-ray and chest CT scan dated December 24, 2015  FINDINGS: There is a persistent left apical pneumothorax amounting to 10% to 15% of the lung volume. There is left lower lobe atelectasis and likely small air-fluid level. The meniscus is no longer evident. Multiple posterior lateral left rib fractures are again observed. There is no mediastinal shift. The right lung is adequately inflated and grossly clear. The cardiac silhouette is enlarged. The pulmonary vascularity is normal. There is calcification in the wall of the aortic arch. There is S shaped thoracolumbar scoliosis.  IMPRESSION: Stable appearance of the 10-15% left apical pneumothorax. The meniscus in the lower left hemi thorax is no longer evident. There is persistent left lower lobe atelectasis or pneumonia and likely left pleural effusion.  Multiple posterior and lateral left rib fractures.  Thoracic aortic atherosclerosis.   Electronically Signed   By: David  SwazilandJordan M.D.   On: 12/25/2015 07:52   Physical Exam General  appearance: alert and no distress Resp: clear to auscultation bilaterally Cardio: regular rate and rhythm GI: normal findings: bowel sounds normal and soft, non-tender Pulses: 2+ and symmetric   Assessment/Plan: Fall Multiple left rib fxs w/HPTX -- Doing well clinically, may be able to avoid tube placement, will d/w MD. Non-narcotics for pain. Multiple medical problems -- Home meds FEN -- Advance diet, SL IV VTE -- SCD's, Lovenox Dispo -- Transfer to floor, PT/OT consult    Freeman CaldronMichael J. Mauro Arps, PA-C Pager: [phone] General Trauma [place] Pager: [phone]  12/25/2015

## 2015-12-25 NOTE — Evaluation (Signed)
Physical Therapy Evaluation Patient Details Name: Ashlee DellJeannette Cabello MRN: [id number] DOB: 09-27-1916 Today's Date: 12/25/2015   History of Present Illness  Fall with Multiple left rib fxs w/HPTX.  Son states pt was completely independent prior to admit.    Clinical Impression  Pt admitted with above diagnosis. Pt currently with functional limitations due to the deficits listed below (see PT Problem List). Pt was able to get up and ambulate but did have pain.  Bil HHA needed with gait instability noted. Pt movement and steadiness with movement is greatly limited by pain.  Son present and when PT discussed home plans son abruptly stopped this PT and said that plans are premature as pt is too acute to make any decisions right now.  PT explained that pt most likely will need to have 24 hour care initially upon d/c and son does state she can stay with he and his wife.  However he did not want to talk about equipment or f/u today.  Will reassess tomorrow and make recommendations further.  FYI pt is from [place] and lived independently.  She was coming for Thanksgiving anyway but plans are for her to eventually go back to [place] per son. Will follow acutely. Pt will benefit from skilled PT to increase their independence and safety with mobility to allow discharge to the venue listed below.     Follow Up Recommendations Home health PT;Supervision/Assistance - 24 hour    Equipment Recommendations  Other (comment) (TBA)    Recommendations for Other Services       Precautions / Restrictions Precautions Precautions: Fall Restrictions Weight Bearing Restrictions: No      Mobility  Bed Mobility Overal bed mobility: Needs Assistance Bed Mobility: Rolling;Sidelying to Sit Rolling: Min assist Sidelying to sit: Min assist       General bed mobility comments: Pt needed min assist for LEs and for elevation of trunk.   Transfers Overall transfer level: Needs assistance Equipment used: 2 person hand held  assist Transfers: Sit to/from Stand Sit to Stand: Mod assist;+2 physical assistance         General transfer comment: Pt needed +2 HHA to assist pt off bed as she was hurting and could not stand pushing up from bed.  Once up, posterior lean and needed incr assist for postural stability.  Pt grabbing at her perineal area stating she needed to use bathroom (makeshift diaper inplace) therefore this PT and son provided HHA and pt walked into bathroom.  Mod assist to assist pt onto toilet and off.  Ambulation/Gait Ambulation/Gait assistance: Mod assist Ambulation Distance (Feet): 120 Feet Assistive device: 1 person hand held assist Gait Pattern/deviations: Step-to pattern;Decreased step length - left;Decreased step length - right;Decreased stride length;Shuffle;Antalgic;Leaning posteriorly;Staggering left;Staggering right;Trunk flexed;Narrow base of support   Gait velocity interpretation: Below normal speed for age/gender General Gait Details: Pt ambulates with antalgic gait pattern.  Pt needed at least 1 HHA if not 2 persons at times.  Pt very unsteady on her feet.  Pain in left ribs limiting pt .  Stairs            Wheelchair Mobility    Modified Rankin (Stroke Patients Only)       Balance Overall balance assessment: Needs assistance Sitting-balance support: No upper extremity supported;Feet supported Sitting balance-Leahy Scale: Poor Sitting balance - Comments: requiring UE support to sit EOB.  Postural control: Posterior lean Standing balance support: Bilateral upper extremity supported;Single extremity supported;During functional activity Standing balance-Leahy Scale: Poor Standing balance comment: requiring  single and bil UE support at times for balance in standing.  Washed hands at sink and still needed mod assist for static standig balance.              High level balance activites: Direction changes;Turns;Sudden stops High Level Balance Comments: Pt needed mod  assist for challenges to balance.              Pertinent Vitals/Pain Pain Assessment: 0-10 Pain Score: 8  Pain Location: left ribs Pain Descriptors / Indicators: Aching;Grimacing;Guarding;Sore Pain Intervention(s): Limited activity within patient's tolerance;Monitored during session;Premedicated before session;Repositioned  VSS    Home Living Family/patient expects to be discharged to:: Private residence Living Arrangements: Alone Available Help at Discharge: Family;Available 24 hours/day Type of Home: House Home Access: Stairs to enter Entrance Stairs-Rails: None Entrance Stairs-Number of Steps: 3 Home Layout: One level Home Equipment: Cane - single point Additional Comments: Did not use cane at home    Prior Function Level of Independence: Independent;Needs assistance   Gait / Transfers Assistance Needed: Pt needed no assist for ambulation PTA  per son she was walking downhill to mailbox to get her mail   ADL's / Homemaking Assistance Needed: a caregiver/neighbor came in to assist with making meals 1 hour day  Comments: Son states that pt can come to his home and that he and wife work from home.  He felt that PT premature and that she will be fine once she goes to their home and will not need equipment or follow up.      Hand Dominance        Extremity/Trunk Assessment   Upper Extremity Assessment: Defer to OT evaluation           Lower Extremity Assessment: Overall WFL for tasks assessed      Cervical / Trunk Assessment: Kyphotic  Communication   Communication: No difficulties  Cognition Arousal/Alertness: Awake/alert Behavior During Therapy: Anxious Overall Cognitive Status: History of cognitive impairments - at baseline       Memory: Decreased short-term memory;Decreased recall of precautions              General Comments      Exercises     Assessment/Plan    PT Assessment Patient needs continued PT services  PT Problem List Decreased  activity tolerance;Decreased balance;Decreased mobility;Decreased knowledge of use of DME;Decreased safety awareness;Decreased knowledge of precautions;Pain          PT Treatment Interventions DME instruction;Gait training;Functional mobility training;Therapeutic activities;Therapeutic exercise;Balance training;Stair training;Patient/family education    PT Goals (Current goals can be found in the Care Plan section)  Acute Rehab PT Goals Patient Stated Goal: to go home eventually but may stay with son first PT Goal Formulation: With patient/family Time For Goal Achievement: 01/08/16 Potential to Achieve Goals: Good    Frequency Min 5X/week   Barriers to discharge        Co-evaluation               End of Session Equipment Utilized During Treatment: Gait belt Activity Tolerance: Patient limited by fatigue;Patient limited by pain Patient left: in bed;with call bell/phone within reach;with family/visitor present Nurse Communication: Mobility status         Time: 1450-1517 PT Time Calculation (min) (ACUTE ONLY): 27 min   Charges:   PT Evaluation $PT Eval Moderate Complexity: 1 Procedure PT Treatments $Gait Training: 8-22 mins   PT G Codes:        Spencer Cardinal F Jackye Dever 12/25/2015, 4:06 PM Mohanad Carsten WPS ResourcesWhite,PT  Acute Rehabilitation [phone] (pager)

## 2015-12-25 NOTE — Progress Notes (Signed)
Patient transferred to [facility] Room 25 via wheelchair on transport monitor accompanied by RN and son.  All belongings with patient. Report given to receiving RN  Patient tolerated well.

## 2015-12-25 NOTE — Progress Notes (Signed)
Pt received to 6N258 via wheelchair from 3S.  Pt AAO X2.  Pt on RA.  Pt has 20G to Rt AC SL.  Pt has bruising to the lt flank area and complains of pain to that area. Pt has rash to rt side of face.  Rest of skin C/D/I.   Belongings and son to bedside.  Report rcvd from Baker Hughes IncorporatedMatther Slawter, RN.  Will continue to monitor.

## 2015-12-25 NOTE — Progress Notes (Signed)
Patient ID: Ashlee DellJeannette Gomez, female   DOB: 02/10/1916, 82 y.o.   MRN: [id number]  Request for left chest tube placement for hemopneumothorax per Trauma team  Dr Fredia SorrowYamagata has reviewed imaging Feels fluid collection is VERY small. Would be very difficult to get tube in place PTX is small but would consider chest tube placement if PTX is worse in 24-48 hrs  [facility]

## 2015-12-25 NOTE — Care Management Note (Signed)
Case Management Note  Patient Details  Name: Ashlee Gomez MRN: [id number] Date of Birth: Aug 25, 1916  Subjective/Objective:   Pt admitted on 12/24/15 s/p fall with multiple Lt rib fx with hemopneumothorax.  PTA, pt independent, lives at home alone in [place], [place].  Pt visiting her son here in [place] for Thanksgiving, who is at bedside.                   Action/Plan: Met with pt and son to discuss dc plans.  Son states pt will stay at his home until she is able to return home at an independent level.  Will follow for PT/OT recommendations.    Expected Discharge Date:  12/29/15               Expected Discharge Plan:  [place]  In-House Referral:     Discharge planning Services  CM Consult  Post Acute Care Choice:    Choice offered to:     DME Arranged:    DME Agency:     HH Arranged:    [place] Agency:     Status of Service:  In process, will continue to follow  If discussed at Long Length of Stay Meetings, dates discussed:    Additional Comments:  Ella Bodo, RN 12/25/2015, 4:55 PM

## 2015-12-26 ENCOUNTER — Inpatient Hospital Stay (HOSPITAL_COMMUNITY): Payer: Medicare Other

## 2015-12-26 LAB — PROTIME-INR
INR: 1.01
Prothrombin Time: 13.3 seconds (ref 11.4–15.2)

## 2015-12-26 LAB — APTT: APTT: 32 s (ref 24–36)

## 2015-12-26 MED ORDER — OXYCODONE-ACETAMINOPHEN 5-325 MG PO TABS
1.0000 | ORAL_TABLET | Freq: Four times a day (QID) | ORAL | Status: DC | PRN
  Administered 2015-12-26 (×2): 1 via ORAL
  Filled 2015-12-26 (×2): qty 1

## 2015-12-26 NOTE — Progress Notes (Signed)
Occupational Therapy Evaluation Patient Details Name: Gloriajean DellJeannette Streetman MRN: [id number] DOB: [birth year] Today's Date: 12/26/2015    History of Present Illness Fall with Multiple left rib fxs w/HPTX.  Son states pt was completely independent prior to admit.     Clinical Impression   PTA, pt lived alone and was independent with mobility and ADL. PT presents with significant funcitonal decline and requires Mod A with mobility and ADL. Pt will need 24/7 assistance at DC with all mobility and ADL. [facility] and [facility]. Son asking about respite care to assist if he or his wife need to work. Will follow acutely to facilitate safe DC home with son.     Follow Up Recommendations  Home health OT;Supervision/Assistance - 24 hour    Equipment Recommendations  3 in 1 bedside comode;Tub/shower bench    Recommendations for Other Services       Precautions / Restrictions Precautions Precautions: Fall      Mobility Bed Mobility               General bed mobility comments: OOB in chair  Transfers Overall transfer level: Needs assistance Equipment used: 1 person hand held assist Transfers: Sit to/from Stand Sit to Stand: Mod assist              Balance     Sitting balance-Leahy Scale: Fair       Standing balance-Leahy Scale: Poor                              ADL Overall ADL's : Needs assistance/impaired     Grooming: Minimal assistance Grooming Details (indicate cue type and reason): difficulty due ot pain -limits LUE ROM Upper Body Bathing: Moderate assistance;Sitting   Lower Body Bathing: Moderate assistance;Sit to/from stand   Upper Body Dressing : Moderate assistance;Sitting   Lower Body Dressing: Moderate assistance   Toilet Transfer: Moderate assistance;Ambulation   Toileting- Clothing Manipulation and Hygiene: Minimal assistance;Sit to/from stand       Functional mobility during ADLs: Moderate assistance General ADL  Comments: limited by pain, decreased balance and decreased ROM. Discussed available DME, including recommendation for 3in1 and tub bench     Vision     Perception     Praxis      Pertinent Vitals/Pain Pain Assessment: Faces Faces Pain Scale: Hurts even more Pain Location: L ribs Pain Descriptors / Indicators: Aching;Discomfort;Grimacing;Moaning Pain Intervention(s): Limited activity within patient's tolerance;Repositioned;Heat applied     Hand Dominance Right   Extremity/Trunk Assessment Upper Extremity Assessment Upper Extremity Assessment: Generalized weakness   Lower Extremity Assessment Lower Extremity Assessment: Generalized weakness   Cervical / Trunk Assessment Cervical / Trunk Assessment: Kyphotic   Communication Communication Communication: HOH   Cognition Arousal/Alertness: Awake/alert Behavior During Therapy: Anxious Overall Cognitive Status: History of cognitive impairments - at baseline       Memory: Decreased short-term memory;Decreased recall of precautions (slow processing/problem solving)  question that pt was more cognitively impaired at baseline than husband realizes           General Comments       Exercises       Shoulder Instructions      Home Living Family/patient expects to be discharged to:: Private residence Living Arrangements: Alone Available Help at Discharge: Family;Available 24 hours/day Type of Home: House Home Access: Stairs to enter Entergy CorporationEntrance Stairs-Number of Steps: 3 Entrance Stairs-Rails: None Home Layout: One level  Bathroom Shower/Tub: Therapist, sportsTub/shower unit Shower/tub characteristics: Engineer, building servicesCurtain Bathroom Toilet: Pharmacist, communitytandard Bathroom Accessibility: Yes How Accessible: Accessible via walker Home Equipment: Cane - single point   Additional Comments: Plans to DC to son's house intitially      Prior Functioning/Environment Level of Independence: Independent;Needs assistance  Gait / Transfers Assistance Needed: Pt  needed no assist for ambulation PTA  per son she was walking downhill to mailbox to get her mail  ADL's / Homemaking Assistance Needed: a caregiver/neighbor came in to assist with making meals 1 hour day            OT Problem List: Decreased strength;Decreased range of motion;Decreased activity tolerance;Impaired balance (sitting and/or standing);Decreased cognition;Decreased safety awareness;Decreased knowledge of use of DME or AE;Cardiopulmonary status limiting activity;Impaired UE functional use;Pain   OT Treatment/Interventions: Self-care/ADL training;Therapeutic exercise;Energy conservation;DME and/or AE instruction;Therapeutic activities;Cognitive remediation/compensation;Patient/family education;Balance training    OT Goals(Current goals can be found in the care plan section) Acute Rehab OT Goals Patient Stated Goal: to eventaully go home and live alone OT Goal Formulation: With patient/family Time For Goal Achievement: 01/09/16 Potential to Achieve Goals: Good ADL Goals Pt Will Perform Upper Body Bathing: with min assist;with caregiver independent in assisting;sitting Pt Will Perform Upper Body Dressing: with min assist;with caregiver independent in assisting;sitting Pt Will Transfer to Toilet: with min assist;ambulating (with caregiver) Pt Will Perform Toileting - Clothing Manipulation and hygiene: with min guard assist;with caregiver independent in assisting;sit to/from stand Pt Will Perform Tub/Shower Transfer: with caregiver independent in assisting;with min assist;tub bench;ambulating  OT Frequency: Min 2X/week   Barriers to D/C:            Co-evaluation              End of Session Nurse Communication: Mobility status  Activity Tolerance: Patient tolerated treatment well Patient left: in chair;with call bell/phone within reach;with chair alarm set;with family/visitor present   Time: 1241-1306 OT Time Calculation (min): 25 min Charges:  OT General Charges $OT  Visit: 1 Procedure OT Evaluation $OT Eval Moderate Complexity: 1 Procedure OT Treatments $Self Care/Home Management : 8-22 mins G-Codes:    Taylon Coole,HILLARY 12/26/2015, 3:01 PM

## 2015-12-26 NOTE — Progress Notes (Addendum)
Physical Therapy Treatment Patient Details Name: Ashlee Gomez MRN: [id number] DOB: 10/22/1916 Today's Date: 12/26/2015    History of Present Illness Fall with Multiple left rib fxs w/HPTX.  Son states pt was completely independent prior to admit.      PT Comments    Pt admitted with above diagnosis. Pt currently with functional limitations due to the deficits listed below (see PT Problem List). Pt progressing doing a little better with gait today but still needs mod assist.  Son and this PT had long discussion about accomodations that need to be made at his home.  Began discussion of possible equipment needed.  Gave pt handout regarding falls at home. Son receptive to [facility] therapies and understands pt may need some equipment at home.  Will follow acutely.   Pt will benefit from skilled PT to increase their independence and safety with mobility to allow discharge to the venue listed below.    Follow Up Recommendations  Home health PT;Supervision/Assistance - 24 hour, HHAide and HHOT     Equipment Recommendations  Other (comment) (TBA but possibly RW and 3N1)    Recommendations for Other Services OT consult     Precautions / Restrictions Precautions Precautions: Fall Restrictions Weight Bearing Restrictions: No    Mobility  Bed Mobility Overal bed mobility: Needs Assistance Bed Mobility: Rolling;Sidelying to Sit Rolling: Min assist Sidelying to sit: Min assist;+2 for physical assistance       General bed mobility comments: Pt needed min assist for LEs and for elevation of trunk.   Transfers Overall transfer level: Needs assistance Equipment used: 2 person hand held assist Transfers: Sit to/from Stand Sit to Stand: Mod assist;+2 physical assistance         General transfer comment: Pt needed +2 HHA to assist pt off bed as she was hurting and could not stand pushing up from bed.  Once up, posterior lean and needed incr assist for postural stability.  Pt grabbing at her  perineal area stating she needed to use bathroom therefore this PT and son provided HHA and pt walked into bathroom.  Mod assist to assist pt onto toilet and off.  Ambulation/Gait Ambulation/Gait assistance: Mod assist Ambulation Distance (Feet): 150 Feet Assistive device: 1 person hand held assist;Rolling walker (2 wheeled) Gait Pattern/deviations: Step-to pattern;Decreased step length - right;Decreased step length - left;Shuffle;Decreased stride length;Antalgic;Leaning posteriorly;Staggering left;Staggering right;Trunk flexed;Narrow base of support   Gait velocity interpretation: Below normal speed for age/gender General Gait Details: Pt ambulates with antalgic gait pattern.  Pt needed at least 1 HHA and initiated education with RW use.  Pt very unsteady on her feet with +1 HHA.  Pt did a little better with RW with less cues and less assist than with +1 HHA.  Pt still needed mod assist at times with RW however as pt not used to it and had trouble with turns.  Pain in left ribs limiting pt .   Stairs            Wheelchair Mobility    Modified Rankin (Stroke Patients Only)       Balance Overall balance assessment: Needs assistance;History of Falls Sitting-balance support: Bilateral upper extremity supported;Feet supported Sitting balance-Leahy Scale: Poor Sitting balance - Comments: requiring UE support to sit EOB.  Postural control: Posterior lean Standing balance support: Bilateral upper extremity supported;During functional activity Standing balance-Leahy Scale: Poor Standing balance comment: Requires bil UE support in standing and dynamic activity with mod assist with +1 HHA or RW.  High level balance activites: Direction changes;Turns;Sudden stops High Level Balance Comments: Pt needed mod assist for challenges to balance.     Cognition Arousal/Alertness: Awake/alert Behavior During Therapy: Anxious Overall Cognitive Status: History of cognitive  impairments - at baseline       Memory: Decreased short-term memory;Decreased recall of precautions              Exercises      General Comments General comments (skin integrity, edema, etc.): Pt urinated on floor on the way to bathroom.  Had to assist pt with full bath and change pts gown and socks as she was wet with urine.  Pt assisted with upper body bathing.       Pertinent Vitals/Pain Pain Assessment: 0-10 Pain Score: 6  Pain Location: left ribs Pain Descriptors / Indicators: Aching;Grimacing;Guarding Pain Intervention(s): Limited activity within patient's tolerance;Monitored during session;Repositioned  VSS  Home Living                      Prior Function            PT Goals (current goals can now be found in the care plan section) Progress towards PT goals: Progressing toward goals    Frequency    Min 5X/week      PT Plan Current plan remains appropriate    Co-evaluation             End of Session Equipment Utilized During Treatment: Gait belt Activity Tolerance: Patient limited by fatigue;Patient limited by pain Patient left: with family/visitor present;in chair;with call bell/phone within reach;with chair alarm set     Time: 1011-1101 PT Time Calculation (min) (ACUTE ONLY): 50 min  Charges:  $Gait Training: 8-22 mins $Therapeutic Activity: 8-22 mins $Self Care/Home Management: 8-22                    G Codes:      Amadeo GarnetDawn F Amro Winebarger 12/26/2015, 12:02 PM Hawa Henly,PT Acute Rehabilitation [phone] (pager)

## 2015-12-26 NOTE — Progress Notes (Signed)
LOS: 2 days   Subjective: Pt without new c/o. States pain is controlled. No SOB or cough. Son at bedside. He states he would prefer the pt stay one more day.    Objective: Vital signs in last 24 hours: Temp:  [97.4 F (36.3 C)-99.4 F (37.4 C)] 97.4 F (36.3 C) (11/21 0639) Pulse Rate:  [75-110] 110 (11/21 0639) Resp:  [14-22] 20 (11/21 0639) BP: (118-169)/(59-83) 139/83 (11/21 0639) SpO2:  [93 %-97 %] 94 % (11/21 0639) Last BM Date: 12/24/15  IS: 1000mL  Laboratory  CBC  Recent Labs  12/24/15 1750 12/25/15 0336  WBC 10.6* 8.1  HGB 12.0 11.2*  HCT 35.0* 33.5*  PLT 229 222   BMET  Recent Labs  12/24/15 1750 12/25/15 0336  NA 130* 134*  K 4.1 4.0  CL 96* 102  CO2 25 24  GLUCOSE 92 80  BUN 25* 15  CREATININE 0.96 0.89  CALCIUM 8.5* 8.0*   CLINICAL DATA:  Traumatic hemopneumothorax, LEFT rib fractures  EXAM: PORTABLE CHEST 1 VIEW  COMPARISON:  Portable exam 0650 hours compared to 12/25/2015  FINDINGS: Upper normal heart size with normal mediastinal contours and pulmonary vascularity.  Atherosclerotic calcification aorta.  Persistent atelectasis versus consolidation in LEFT lower lobe.  Subsegmental atelectasis at RIGHT base and RIGHT mid lung as well.  Small left pleural effusion present.  Persistent small LEFT apex pneumothorax, unchanged.  Bones demineralized.  Multiple LEFT rib fractures again identified.  IMPRESSION: Persistent LEFT T hemopneumothorax little changed.  Atelectasis versus consolidation LEFT lower lobe unchanged.  Increased subsegmental atelectasis at RIGHT base and in RIGHT mid lung.   Electronically Signed   By: Ulyses SouthwardMark  Boles M.D.   On: 12/26/2015 08:04   Physical Exam General appearance: alert and no distress Resp: clear to auscultation bilaterally, decreased breath sounds of LLL. Cardio: regular rate and rhythm, S1, S2 normal GI: +BS,soft, non-tender, non-distented  Pulses: radial and DP pulses  2+ and symmetric  Assessment/Plan: Fall Multiple left rib fxs w/HPTX -- Doing well clinically, may be able to avoid tube placement, PO tramadol for pain, pt still needing IV morphine. Chest xray today showed unchanged PTX and left pleural effusion, Increased atelectasis at right mid and base of lung. Encourage IS Multiple medical problems -- Home meds FEN -- Advance diet, SL IV VTE -- SCD's, Lovenox Dispo -- PT/OT, possible d/c tomorrow, pending chest xray tomorrow AM, son will care for pt until she is able to go home Pt is recommending HH PT with 24hr supervision/assistance   Mattie MarlinJessica Ryzen Deady, [facility] Pager [phone] General Trauma PA pager [phone]   12/26/2015

## 2015-12-26 NOTE — Progress Notes (Signed)
Met with pt and son to discuss [place] follow up at discharge.  Son concerned, as pt plans to return to [place], [place] as soon as she is able.  She does plan to stay with her son until she is feeling better and returned to baseline.  Pt's son given [facility] list for [facility] to review; hopeful to find an agency that has [place] in its service area.  Will investigate this and follow up with pt/son in AM.  Wife Vaughan Basta on speaker phone during our conversation.    Reinaldo Raddle, RN, BSN  Trauma/Neuro ICU Case Manager [phone]

## 2015-12-27 MED ORDER — OXYCODONE-ACETAMINOPHEN 5-325 MG PO TABS: 1.0000 | ORAL_TABLET | Freq: Four times a day (QID) | ORAL | 0 refills | Status: AC | PRN

## 2015-12-27 MED ORDER — TRAMADOL HCL 50 MG PO TABS: 50.0000 mg | ORAL_TABLET | Freq: Three times a day (TID) | ORAL | 0 refills | Status: AC

## 2015-12-27 NOTE — Care Management Note (Signed)
Case Management Note  Patient Details  Name: Ashlee Gomez MRN: [id number] Date of Birth: 11-Sep-1916  Subjective/Objective:   Pt medically stable for discharge home today with son. Unable to find [facility] agency that services both [place] and [place].                   Action/Plan: Met with pt and son to finalize discharge arrangements.  Advised to have [facility] arranged in [place] initially, then go through PCP in [place], [place] if further [facility] care still needed upon pt's return home.  Pt/son agreeable to this plan.  Son prefers [facility] for [facility] needs.  Referral to [facility] for [facility] and DME needs.  Start of care likely over the weekend due to Thanksgiving holiday.  Son is Greer Wainright; phone [phone]; Address [address], [place], [place] [address].     Expected Discharge Date:  12/29/15               Expected Discharge Plan:  [place]  In-House Referral:     Discharge planning Services  CM Consult  Post Acute Care Choice:  Home Health Choice offered to:  Adult Children, HC POA / Guardian  DME Arranged:   RW, 3 in 1, Tub bench DME Agency:   [facility] Arranged:  PT, OT, Nurse's Aide [place] Agency:  [facility]  Status of Service:  Completed, signed off  If discussed at [place] of Stay Meetings, dates discussed:    Additional Comments: Faxed Pt's H&P, DC summary, xrays, recent labs to pt's PCP, per request.  Dr. Glenetta Borg [phone].    Reinaldo Raddle, RN, BSN  Trauma/Neuro ICU Case Manager [phone]

## 2015-12-27 NOTE — Care Management Important Message (Signed)
Important Message  Patient Details  Name: Gloriajean DellJeannette Carta MRN: [id number] Date of Birth: September 19, 1916   Medicare Important Message Given:  Yes    Nigeria Lasseter Stefan ChurchBratton 12/27/2015, 1:34 PM

## 2015-12-27 NOTE — Discharge Summary (Signed)
Physician Discharge Summary   Patient ID: Ashlee DellJeannette Gomez MRN: [id number] DOB/AGE: April 22, 1916 82 y.o.  Admit date: 12/24/2015 Discharge date: 12/27/2015  Discharge Diagnoses Patient Active Problem List   Diagnosis Date Noted  . Traumatic hemopneumothorax 12/25/2015  . Multiple rib fractures 12/24/2015    Consultants Dr. Fredia SorrowYamagata, Interventional Radiology  Procedures None  HPI:   This is a 82 year old female transferred [facility]. She presented there with chest pain. She apparently fell approximate 3 days prior to arriving at the ED.  She does not remember falling. She lives alone and may have some mild dementia. Her son was with her in the ED. She denies shortness of breath. She reported that her pain was left-sided and toward the back and shoulder. She denied fever or chills. She denies abdominal pain. [facility] after a chest x-ray and CT scan showed a left-sided pneumothorax with multiple rib fractures. Associated symptoms included chest pain. Pertinent negatives included no abdominal pain, chills, congestion, coughing, diaphoresis, fever, nausea, neck pain or vomiting.  Hospital Course: Pt was admitted to the floor under the care of the trauma team for treatment of her multiple rib fractures and traumatic hemopneumothorax. Serial chest xrays showed no worsening of the pneumothorax or left pleural effusion but did reveal worsening atelectasis of the right mid and base of right lung. Interventional radiology consulted on the pt and felt that it would be very difficult to get a chest tube in place and stated the fluid collection was very small. No CT was placed. They would have considered a CT if PTX worsened in 24-48 hrs. Pt was encouraged to use and trained on the IS. Pt's pain was well controlled on PO medications prior to discharge. PT and OT recommended HH and supervision/assistance - 24hrs. Pt's son was taking pt to live with him until she is stable  enough to return to her home in [place]. HH was ordered for pt prior to discharge. Pt was discharge home with her son. Pt was stable and in good condition at the time of discharge.       Medication List    TAKE these medications   desonide 0.05 % ointment Commonly known as:  DESOWEN Apply 1 application topically 2 (two) times daily.   Fish Oil 1000 MG Caps Take 1,000 mg by mouth daily.   glucosamine-chondroitin 500-400 MG tablet Take 1 tablet by mouth daily.   ibuprofen 200 MG tablet Commonly known as:  ADVIL,MOTRIN Take 200-400 mg by mouth every 6 (six) hours as needed.   multivitamin with minerals Tabs tablet Take 1 tablet by mouth daily.   oxyCODONE-acetaminophen 5-325 MG tablet Commonly known as:  PERCOCET/ROXICET Take 1 tablet by mouth every 6 (six) hours as needed for moderate pain.   SYNTHROID 50 MCG tablet Generic drug:  levothyroxine Take 50 mcg by mouth daily before breakfast.   traMADol 50 MG tablet Commonly known as:  ULTRAM Take 1 tablet (50 mg total) by mouth 3 (three) times daily.            Durable Medical Equipment        Start     Ordered   12/27/15 1019  For home use only DME Tub bench  Once     12/27/15 1018   12/27/15 1018  For home use only DME Walker rolling  Once    Question:  Patient needs a walker to treat with the following condition  Answer:  Ribs, multiple fractures   12/27/15  1018   12/27/15 1018  For home use only DME Bedside commode  Once    Question:  Patient needs a bedside commode to treat with the following condition  Answer:  Ribs, multiple fractures   12/27/15 1018       Follow-up Information    [facility]. Call.   Why:  as needed Contact information: [address] [id number] [place]c [place] [id number] [phone]          Signed:  Mattie MarlinJessica Garin Mata, [facility] Central  Surgery Pager [phone] General Trauma PA pager [phone]    12/27/2015, 10:45  AM

## 2015-12-27 NOTE — Progress Notes (Signed)
Pat DC with son to his home. Equipment sent to pt home .

## 2015-12-27 NOTE — Progress Notes (Signed)
LOS: 3 days   Subjective: Pt still complaining of rib pain. Pt denies cough, abdominal pain, SOB, vomiting. No new complaints. Son at bedside.    Objective: Vital signs in last 24 hours: Temp:  [98 F (36.7 C)-98.5 F (36.9 C)] 98.2 F (36.8 C) (11/22 0425) Pulse Rate:  [84-112] 84 (11/22 0425) Resp:  [18-20] 18 (11/22 0425) BP: (129-136)/(67-78) 129/77 (11/22 0425) SpO2:  [93 %-95 %] 95 % (11/22 0425) Last BM Date: 12/24/15  IS: 900  Laboratory  CBC  Recent Labs  12/24/15 1750 12/25/15 0336  WBC 10.6* 8.1  HGB 12.0 11.2*  HCT 35.0* 33.5*  PLT 229 222   BMET  Recent Labs  12/24/15 1750 12/25/15 0336  NA 130* 134*  K 4.1 4.0  CL 96* 102  CO2 25 24  GLUCOSE 92 80  BUN 25* 15  CREATININE 0.96 0.89  CALCIUM 8.5* 8.0*     Physical Exam General appearance: alert, no distress, pleasant sitting up in the chair Resp: clear to auscultation bilaterally, decreased breath sounds of LLL. Cardio: regular rate and rhythm, S1, S2 normal GI: +BS,soft, non-tender, non-distented  Pulses: DP pulses 2+ and symmetric  Assessment/Plan: Fall Multiple left rib fxs w/HPTX-- Doing well clinically, PO tramadol for pain.  Encourage IS Multiple medical problems-- Home meds FEN-- Advance diet VTE-- SCD's, Lovenox Dispo-- d/c home with son today, son will care for pt until she is able to go back to her home. Pt is recommending HH PT with 24hr supervision/assistance.   Ashlee MarlinJessica Tamula Gomez, [facility] Pager [phone] General Trauma PA pager [phone]   12/27/2015

## 2015-12-27 NOTE — Discharge Instructions (Signed)
Take the tramadol as prescribed as needed for pain. Take the percocet as prescribed for severe pain. Continue to use the incentive spirometer at home 3-4 times every hour. Follow up with your primary care provider sooner or go to the ER if you experience worsening pain, cough, fevers, vomiting, or any other concerning symptoms.   Rib Fracture A rib fracture is a break or crack in one of the bones of the ribs. The ribs are a group of long, curved bones that wrap around your chest and attach to your spine. They protect your lungs and other organs in the chest cavity. A broken or cracked rib is often painful, but most do not cause other problems. Most rib fractures heal on their own over time. However, rib fractures can be more serious if multiple ribs are broken or if broken ribs move out of place and push against other structures. What are the causes?  A direct blow to the chest. For example, this could happen during contact sports, a car accident, or a fall against a hard object.  Repetitive movements with high force, such as pitching a baseball or having severe coughing spells. What are the signs or symptoms?  Pain when you breathe in or cough.  Pain when someone presses on the injured area. How is this diagnosed? Your caregiver will perform a physical exam. Various imaging tests may be ordered to confirm the diagnosis and to look for related injuries. These tests may include a chest X-ray, computed tomography (CT), magnetic resonance imaging (MRI), or a bone scan. How is this treated? Rib fractures usually heal on their own in 1-3 months. The longer healing period is often associated with a continued cough or other aggravating activities. During the healing period, pain control is very important. Medication is usually given to control pain. Hospitalization or surgery may be needed for more severe injuries, such as those in which multiple ribs are broken or the ribs have moved out of place. Follow  these instructions at home:  Avoid strenuous activity and any activities or movements that cause pain. Be careful during activities and avoid bumping the injured rib.  Gradually increase activity as directed by your caregiver.  Only take over-the-counter or prescription medications as directed by your caregiver. Do not take other medications without asking your caregiver first.  Apply ice to the injured area for the first 1-2 days after you have been treated or as directed by your caregiver. Applying ice helps to reduce inflammation and pain.  Put ice in a plastic bag.  Place a towel between your skin and the bag.  Leave the ice on for 15-20 minutes at a time, every 2 hours while you are awake.  Perform deep breathing as directed by your caregiver. This will help prevent pneumonia, which is a common complication of a broken rib. Your caregiver may instruct you to:  Take deep breaths several times a day.  Try to cough several times a day, holding a pillow against the injured area.  Use a device called an incentive spirometer to practice deep breathing several times a day.  Drink enough fluids to keep your urine clear or pale yellow. This will help you avoid constipation.  Do not wear a rib belt or binder. These restrict breathing, which can lead to pneumonia. Get help right away if:  You have a fever.  You have difficulty breathing or shortness of breath.  You develop a continual cough, or you cough up thick or bloody sputum.  You feel sick to your stomach (nausea), throw up (vomit), or have abdominal pain.  You have worsening pain not controlled with medications. This information is not intended to replace advice given to you by your health care provider. Make sure you discuss any questions you have with your health care provider. Document Released: 01/21/2005 Document Revised: 06/29/2015 Document Reviewed: 03/25/2012 Elsevier Interactive Patient Education  2017 Elsevier  Inc.   Pneumothorax Introduction A pneumothorax, commonly called a collapsed lung, is a condition in which air leaks from a lung and builds up in the space between the lung and the chest wall (pleural space). The air in a pneumothorax is trapped outside the lung and takes up space, preventing the lung from fully expanding. This is a condition that usually occurs suddenly. The buildup of air may be small or large. A small pneumothorax may go away on its own. When a pneumothorax is larger, it will often require medical treatment and hospitalization. What are the causes? A pneumothorax can sometimes happen quickly with no apparent cause. People with underlying lung problems, particularly COPD or emphysema, are at higher risk of pneumothorax. However, pneumothorax can happen quickly even in people with no prior known lung problems. Trauma, surgery, medical procedures, or injury to the chest wall can also cause a pneumothorax. What are the signs or symptoms? Sometimes a pneumothorax will have no symptoms. When symptoms are present, they can include:  Chest pain.  Shortness of breath.  Increased rate of breathing.  Bluish color to your lips or skin (cyanosis). How is this diagnosed? Pneumothorax is usually diagnosed by a chest X-ray or chest CT scan. Your health care provider will also take a medical history and perform a physical exam to determine why you may have a pneumothorax. How is this treated? A small pneumothorax may go away on its own without treatment. Extra oxygen can sometimes help a small pneumothorax go away more quickly. For a larger pneumothorax or a pneumothorax that is causing symptoms, a procedure is usually needed to drain the air.In some cases, the health care provider may drain the air using a needle. In other cases, a chest tube may be inserted into the pleural space. A chest tube is a small tube placed between the ribs and into the pleural space. This removes the extra air  and allows the lung to expand back to its normal size. A large pneumothorax will usually require a hospital stay. If there is ongoing air leakage into the pleural space, then the chest tube may need to remain in place for several days until the air leak has healed. In some cases, surgery may be needed. Follow these instructions at home:  Only take over-the-counter or prescription medicines as directed by your health care provider.  If a cough or pain makes it difficult for you to sleep at night, try sleeping in a semi-upright position in a recliner or by using 2 or 3 pillows.  Rest and limit activity as directed by your health care provider.  If you had a chest tube and it was removed, ask your health care provider when it is okay to remove the dressing. Until your health care provider says you can remove the dressing, do not allow it to get wet.  Do not smoke. Smoking is a risk factor for pneumothorax.  Do not fly in an airplane or scuba dive until your health care provider says it is okay.  Follow up with your health care provider as directed. Get help  right away if:  You have increasing chest pain or shortness of breath.  You have a cough that is not controlled with suppressants.  You begin coughing up blood.  You have pain that is getting worse or is not controlled with medicines.  You cough up thick, discolored mucus (sputum) that is yellow to green in color.  You have redness, increasing pain, or discharge at the site where a chest tube had been in place (if your pneumothorax was treated with a chest tube).  The site where your chest tube was located opens up.  You feel air coming out of the site where the chest tube was placed.  You have a fever or persistent symptoms for more than 2-3 days.  You have a fever and your symptoms suddenly get worse. This information is not intended to replace advice given to you by your health care provider. Make sure you discuss any  questions you have with your health care provider. Document Released: 01/21/2005 Document Revised: 06/29/2015 Document Reviewed: 06/16/2013  2017 Elsevier

## 2016-01-01 ENCOUNTER — Telehealth (HOSPITAL_COMMUNITY): Payer: Self-pay

## 2016-01-01 ENCOUNTER — Emergency Department (HOSPITAL_COMMUNITY)
Admission: EM | Admit: 2016-01-01 | Discharge: 2016-01-01 | Disposition: A | Discharge status: Home / Self Care | Payer: Medicare Other | Attending: Emergency Medicine | Admitting: Emergency Medicine

## 2016-01-01 ENCOUNTER — Emergency Department (HOSPITAL_COMMUNITY): Payer: Medicare Other

## 2016-01-01 ENCOUNTER — Encounter (HOSPITAL_COMMUNITY): Payer: Self-pay | Admitting: Emergency Medicine

## 2016-01-01 DIAGNOSIS — E860 Dehydration: Secondary | ICD-10-CM | POA: Diagnosis not present

## 2016-01-01 DIAGNOSIS — R42 Dizziness and giddiness: Secondary | ICD-10-CM | POA: Diagnosis not present

## 2016-01-01 LAB — CBC WITH DIFFERENTIAL/PLATELET
BASOS PCT: 1 %
Basophils Absolute: 0.1 10*3/uL (ref 0.0–0.1)
EOS ABS: 0.5 10*3/uL (ref 0.0–0.7)
EOS PCT: 4 %
HCT: 40.7 % (ref 36.0–46.0)
Hemoglobin: 13.7 g/dL (ref 12.0–15.0)
LYMPHS ABS: 4.4 10*3/uL — AB (ref 0.7–4.0)
Lymphocytes Relative: 33 %
MCH: 29 pg (ref 26.0–34.0)
MCHC: 33.7 g/dL (ref 30.0–36.0)
MCV: 86 fL (ref 78.0–100.0)
Monocytes Absolute: 0.9 10*3/uL (ref 0.1–1.0)
Monocytes Relative: 7 %
Neutro Abs: 7.2 10*3/uL (ref 1.7–7.7)
Neutrophils Relative %: 55 %
PLATELETS: 348 10*3/uL (ref 150–400)
RBC: 4.73 MIL/uL (ref 3.87–5.11)
RDW: 13.6 % (ref 11.5–15.5)
WBC: 13.1 10*3/uL — AB (ref 4.0–10.5)

## 2016-01-01 LAB — URINALYSIS, ROUTINE W REFLEX MICROSCOPIC
Glucose, UA: NEGATIVE mg/dL
Hgb urine dipstick: NEGATIVE
Ketones, ur: 15 mg/dL — AB
LEUKOCYTES UA: NEGATIVE
NITRITE: NEGATIVE
PH: 5 (ref 5.0–8.0)
Protein, ur: NEGATIVE mg/dL
SPECIFIC GRAVITY, URINE: 1.03 (ref 1.005–1.030)

## 2016-01-01 LAB — COMPREHENSIVE METABOLIC PANEL
ALT: 32 U/L (ref 14–54)
ANION GAP: 9 (ref 5–15)
AST: 41 U/L (ref 15–41)
Albumin: 3.5 g/dL (ref 3.5–5.0)
Alkaline Phosphatase: 75 U/L (ref 38–126)
BUN: 23 mg/dL — ABNORMAL HIGH (ref 6–20)
CHLORIDE: 98 mmol/L — AB (ref 101–111)
CO2: 27 mmol/L (ref 22–32)
Calcium: 9.2 mg/dL (ref 8.9–10.3)
Creatinine, Ser: 1.26 mg/dL — ABNORMAL HIGH (ref 0.44–1.00)
GFR calc non Af Amer: 35 mL/min — ABNORMAL LOW (ref >60)
GFR, EST AFRICAN AMERICAN: 40 mL/min — AB (ref >60)
Glucose, Bld: 81 mg/dL (ref 65–99)
Potassium: 4.5 mmol/L (ref 3.5–5.1)
SODIUM: 134 mmol/L — AB (ref 135–145)
Total Bilirubin: 0.5 mg/dL (ref 0.3–1.2)
Total Protein: 6.5 g/dL (ref 6.5–8.1)

## 2016-01-01 NOTE — ED Provider Notes (Signed)
[facility]-EMERGENCY DEPT Provider Note   CSN: [id number] Arrival date & time: 01/01/16  2052     History   Chief Complaint Chief Complaint  Patient presents with  . Dizziness    HPI Ashlee Gomez is a 82 y.o. female.  HPI  Patient presents with concern over one episode of dizziness. She is here with her son insisted history of present illness. Patient has history of dementia. However, it seems as though the history is accurate. Patient has a notable history of discharge from this facility 5 days ago. She was admitted 2 days prior that after having a fall which resulted in multiple rib fractures, hemopneumothorax, left-sided. She did not have intervention during that hospitalization, was discharged in stable condition. They note that since discharge she has been in generally well, but today, just prior to ED arrival she had one episode of dizziness. There is no concurrent pain, no syncope, no dyspnea, no fever, no cough On secondary questioning, he states the patient has not been drinking substantial amounts of fluid, though she is eating.   Past Medical History:  Diagnosis Date  . Dementia   . Thyroid disease     Patient Active Problem List   Diagnosis Date Noted  . Traumatic hemopneumothorax 12/25/2015  . Multiple rib fractures 12/24/2015    History reviewed. No pertinent surgical history.  OB History    No data available       Home Medications    Prior to Admission medications   Medication Sig Start Date End Date Taking? Authorizing Provider  desonide (DESOWEN) 0.05 % ointment Apply 1 application topically 2 (two) times daily.   Yes Historical Provider, MD  glucosamine-chondroitin 500-400 MG tablet Take 1 tablet by mouth daily.   Yes Historical Provider, MD  ibuprofen (ADVIL,MOTRIN) 200 MG tablet Take 200-400 mg by mouth every 6 (six) hours as needed (for pain).    Yes Historical Provider, MD  Ibuprofen-Diphenhydramine HCl (ADVIL PM) 200-25 MG CAPS Take 1  capsule by mouth at bedtime as needed (for sleep).   Yes Historical Provider, MD  lidocaine (LIDODERM) 5 % Place 1 patch onto the skin daily as needed. Remove & Discard patch within 12 hours or as directed by MD   Yes Historical Provider, MD  Multiple Vitamin (MULTIVITAMIN WITH MINERALS) TABS tablet Take 1 tablet by mouth daily.   Yes Historical Provider, MD  Omega-3 Fatty Acids (FISH OIL) 1000 MG CAPS Take 1,000 mg by mouth daily.   Yes Historical Provider, MD  oxyCODONE-acetaminophen (PERCOCET/ROXICET) 5-325 MG tablet Take 1 tablet by mouth every 6 (six) hours as needed for moderate pain. 12/27/15  Yes Jerre SimonJessica L Focht, PA  SYNTHROID 50 MCG tablet Take 50 mcg by mouth daily before breakfast. 10/11/15  Yes Historical Provider, MD  traMADol (ULTRAM) 50 MG tablet Take 1 tablet (50 mg total) by mouth 3 (three) times daily. 12/27/15  Yes Jerre SimonJessica L Focht, PA    Family History No family history on file.  Social History Social History  Substance Use Topics  . Smoking status: Never Smoker  . Smokeless tobacco: Never Used  . Alcohol use No     Allergies   Patient has no known allergies.   Review of Systems Review of Systems  Constitutional:       Per HPI, otherwise negative  HENT:       Per HPI, otherwise negative  Respiratory:       Per HPI, otherwise negative  Cardiovascular:       Per  HPI, otherwise negative  Gastrointestinal: Negative for vomiting.  Endocrine:       Negative aside from HPI  Genitourinary:       Neg aside from HPI   Musculoskeletal:       Per HPI, otherwise negative  Skin: Negative.   Neurological: Positive for weakness. Negative for syncope.     Physical Exam Updated Vital Signs BP 151/77 (BP Location: Right Arm)   Pulse 79   Temp 97.9 F (36.6 C) (Oral)   Resp 11   Ht 4' 10.5" (1.486 m)   Wt 100 lb (45.4 kg)   SpO2 97%   BMI 20.54 kg/m   Physical Exam  Constitutional: She appears well-developed and well-nourished. No distress.  HENT:  Head:  Normocephalic and atraumatic.  Eyes: Conjunctivae and EOM are normal.  Cardiovascular: Normal rate and regular rhythm.   Pulmonary/Chest: Effort normal and breath sounds normal. No stridor. No respiratory distress.  Tenderness with guarding about the left lower rib cage  Abdominal: She exhibits no distension.  Musculoskeletal: She exhibits no edema.  Neurological: She is alert. No cranial nerve deficit. She exhibits normal muscle tone. Coordination normal.  Skin: Skin is warm and dry.  Psychiatric: She has a normal mood and affect.  Nursing note and vitals reviewed.    ED Treatments / Results  Labs (all labs ordered are listed, but only abnormal results are displayed) Labs Reviewed  CBC WITH DIFFERENTIAL/PLATELET - Abnormal; Notable for the following:       Result Value   WBC 13.1 (*)    Lymphs Abs 4.4 (*)    All other components within normal limits  COMPREHENSIVE METABOLIC PANEL - Abnormal; Notable for the following:    Sodium 134 (*)    Chloride 98 (*)    BUN 23 (*)    Creatinine, Ser 1.26 (*)    GFR calc non Af Amer 35 (*)    GFR calc Af Amer 40 (*)    All other components within normal limits  URINALYSIS, ROUTINE W REFLEX MICROSCOPIC (NOT AT [facility]) - Abnormal; Notable for the following:    Color, Urine AMBER (*)    Bilirubin Urine SMALL (*)    Ketones, ur 15 (*)    All other components within normal limits    EKG  EKG Interpretation  Date/Time:  Monday January 01 2016 20:57:22 EST Ventricular Rate:  90 PR Interval:  124 QRS Duration: 68 QT Interval:  370 QTC Calculation: 452 R Axis:     Text Interpretation:  Sinus rhythm with Premature atrial complexes Low voltage QRS Artifact Abnormal ekg Confirmed by Gerhard Munch  MD [phone]) on 01/01/2016 10:06:00 PM       Radiology Dg Chest 2 View  Result Date: 01/01/2016 CLINICAL DATA:  Shortness of breath tonight, known LEFT rib fractures and recent hemithorax. EXAM: CHEST  2 VIEW COMPARISON:  Chest radiograph  December 26, 2015 FINDINGS: Persistent small, unchanged LEFT apical pneumothorax. Small to moderate layering LEFT pleural effusion. Underlying consolidation. Chronic bronchitic changes. No mediastinal shift. Cardiomediastinal silhouette is nonsuspicious, calcified aortic knob. Scattered granulomata. Acute displaced LEFT fifth through ninth rib fractures. Old RIGHT lateral rib fractures. IMPRESSION: Persistent LEFT hemopneumothorax without mediastinal shift. Similar LEFT lung base consolidation/atelectasis. Re- demonstration of multiple acute LEFT rib fractures. Electronically Signed   By: Awilda Metro M.D.   On: 01/01/2016 21:51    Procedures Procedures (including critical care time)  Medications Ordered in ED Medications - No data to display   Initial Impression /  Assessment and Plan / ED Course  I have reviewed the triage vital signs and the nursing notes.  Pertinent labs & imaging results that were available during my care of the patient were reviewed by me and considered in my medical decision making (see chart for details).  Clinical Course     On repeat exam patient is stable condition, vital signs are unremarkable. I had a lengthy conversation with the patient and her son about all findings, including concern for slight elevation in creatinine, and   importance of appropriate fluid resuscitation. I discussed option of admission for IV resuscitation versus close outpatient follow-up with oral rehydration. Patient and son both had preference for discharge with oral rehydration. I discussed return precautions, follow-up instructions, and this was accommodated.    Final Clinical Impressions(s) / ED Diagnoses   Final diagnoses:  Dizziness  Dehydration     Gerhard Munchobert Ayda Tancredi, MD 01/01/16 [phone]

## 2016-01-01 NOTE — ED Triage Notes (Addendum)
Pt.'s son reported dizziness this evening seeing "spots" with mild SOB , denies fever or chills , admitted last 12/24/2015 for rib fractures .

## 2016-01-01 NOTE — Discharge Instructions (Signed)
As discussed, your evaluation today has been largely reassuring.  But, it is important that you monitor your condition carefully, and do not hesitate to return to the ED if you develop new, or concerning changes in your condition. ? ?Otherwise, please follow-up with your physician for appropriate ongoing care. ? ?

## 2016-01-02 NOTE — Telephone Encounter (Signed)
Gave refill for Perc 5/325, #20

## 2017-10-20 IMAGING — DX DG RIBS W/ CHEST 3+V*L*
3 series · 3 of 3 positions shown · non-contrast
Comparison: None.

CLINICAL DATA: Fall with left rib pain and shortness of breath for
3 days.

EXAM:
LEFT RIBS AND CHEST - 3+ VIEW

[chest pa]
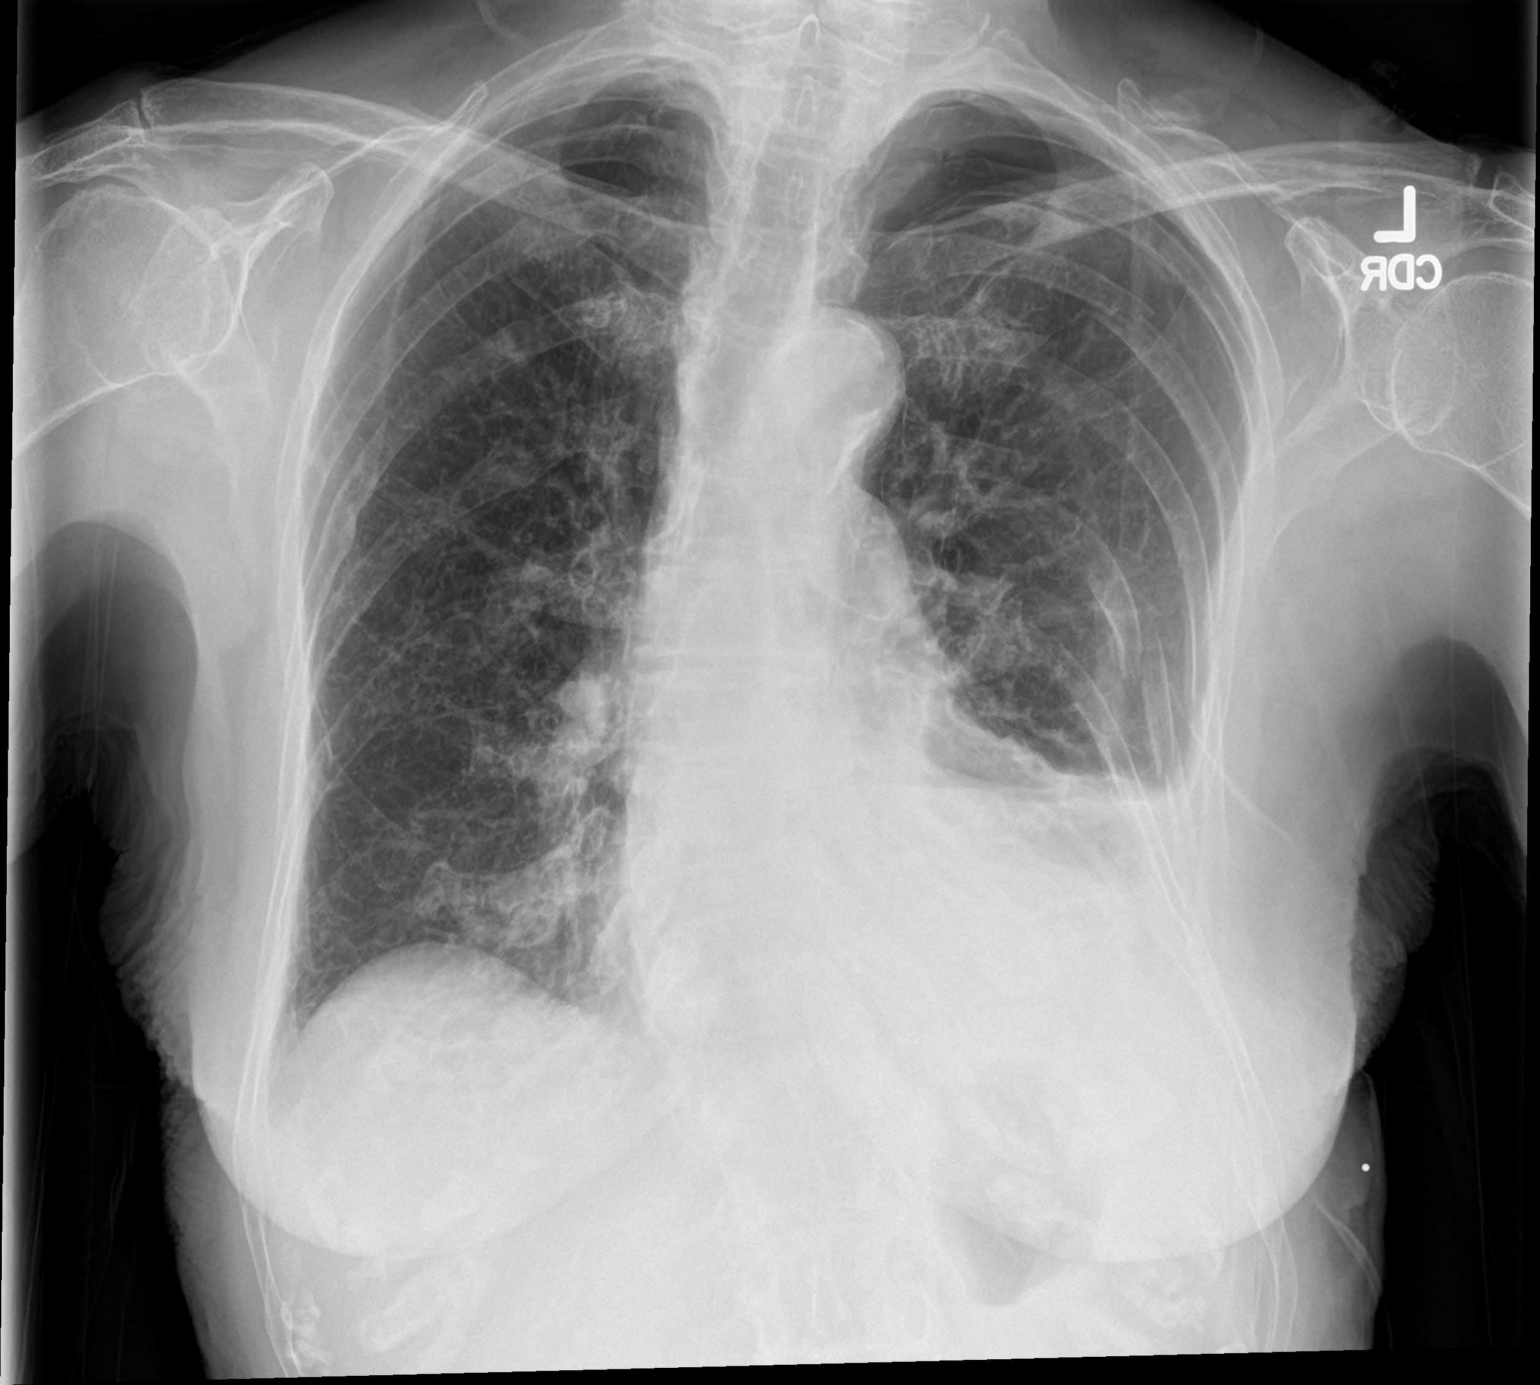

[rib pa obl (1 of 2)]
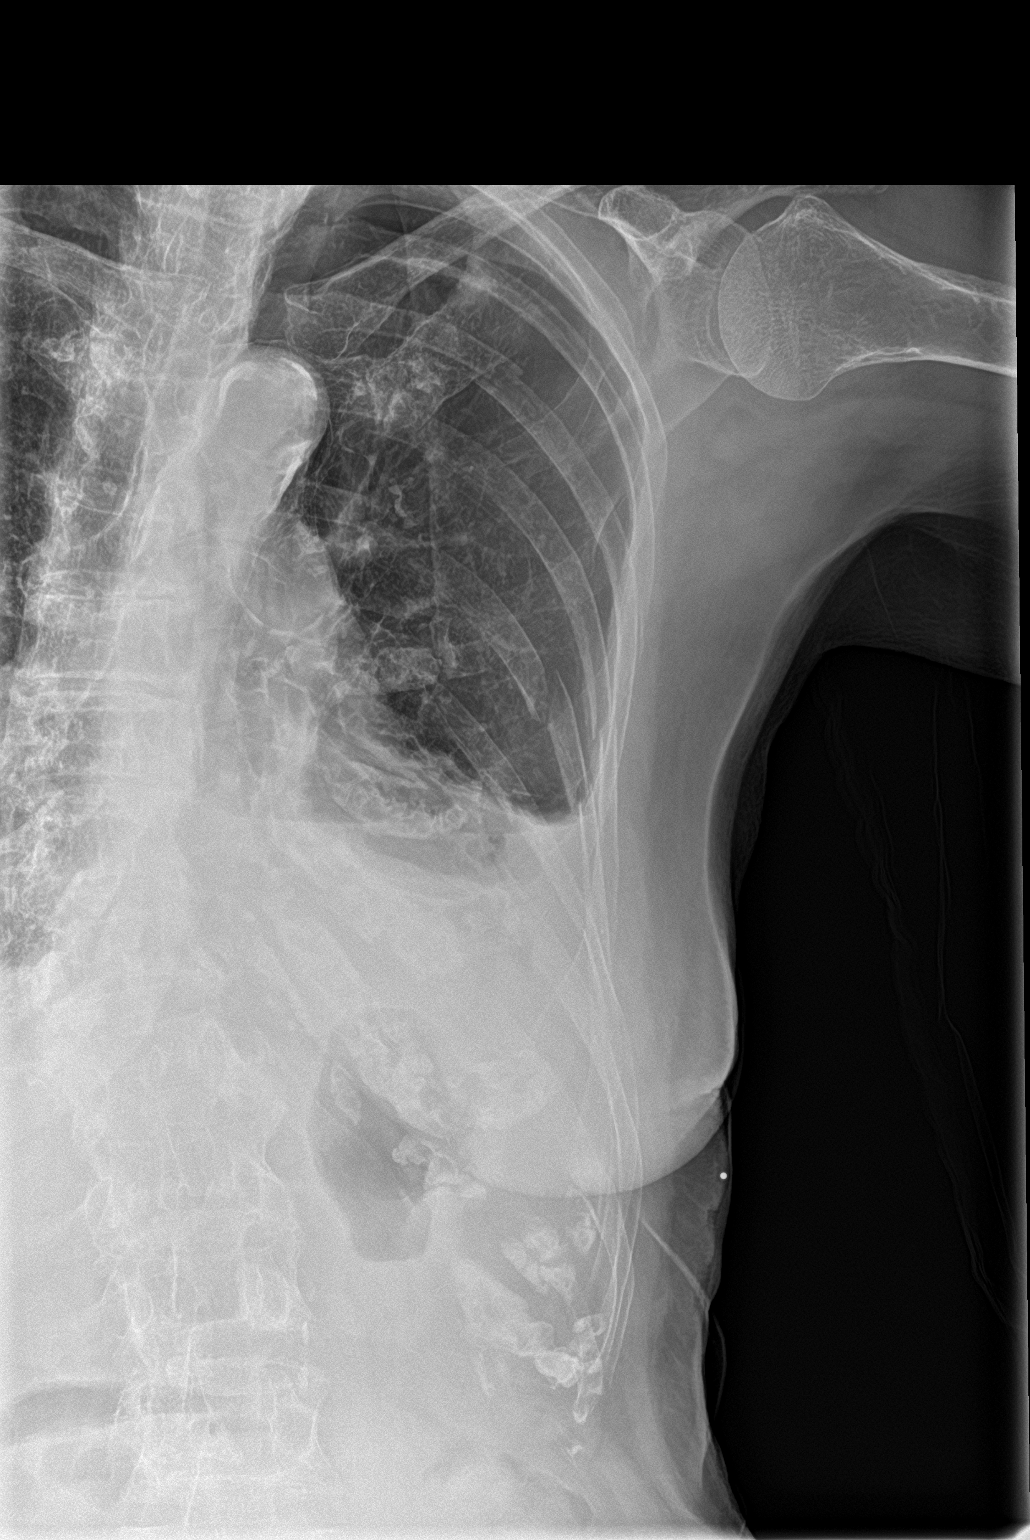

[rib pa obl (2 of 2)]
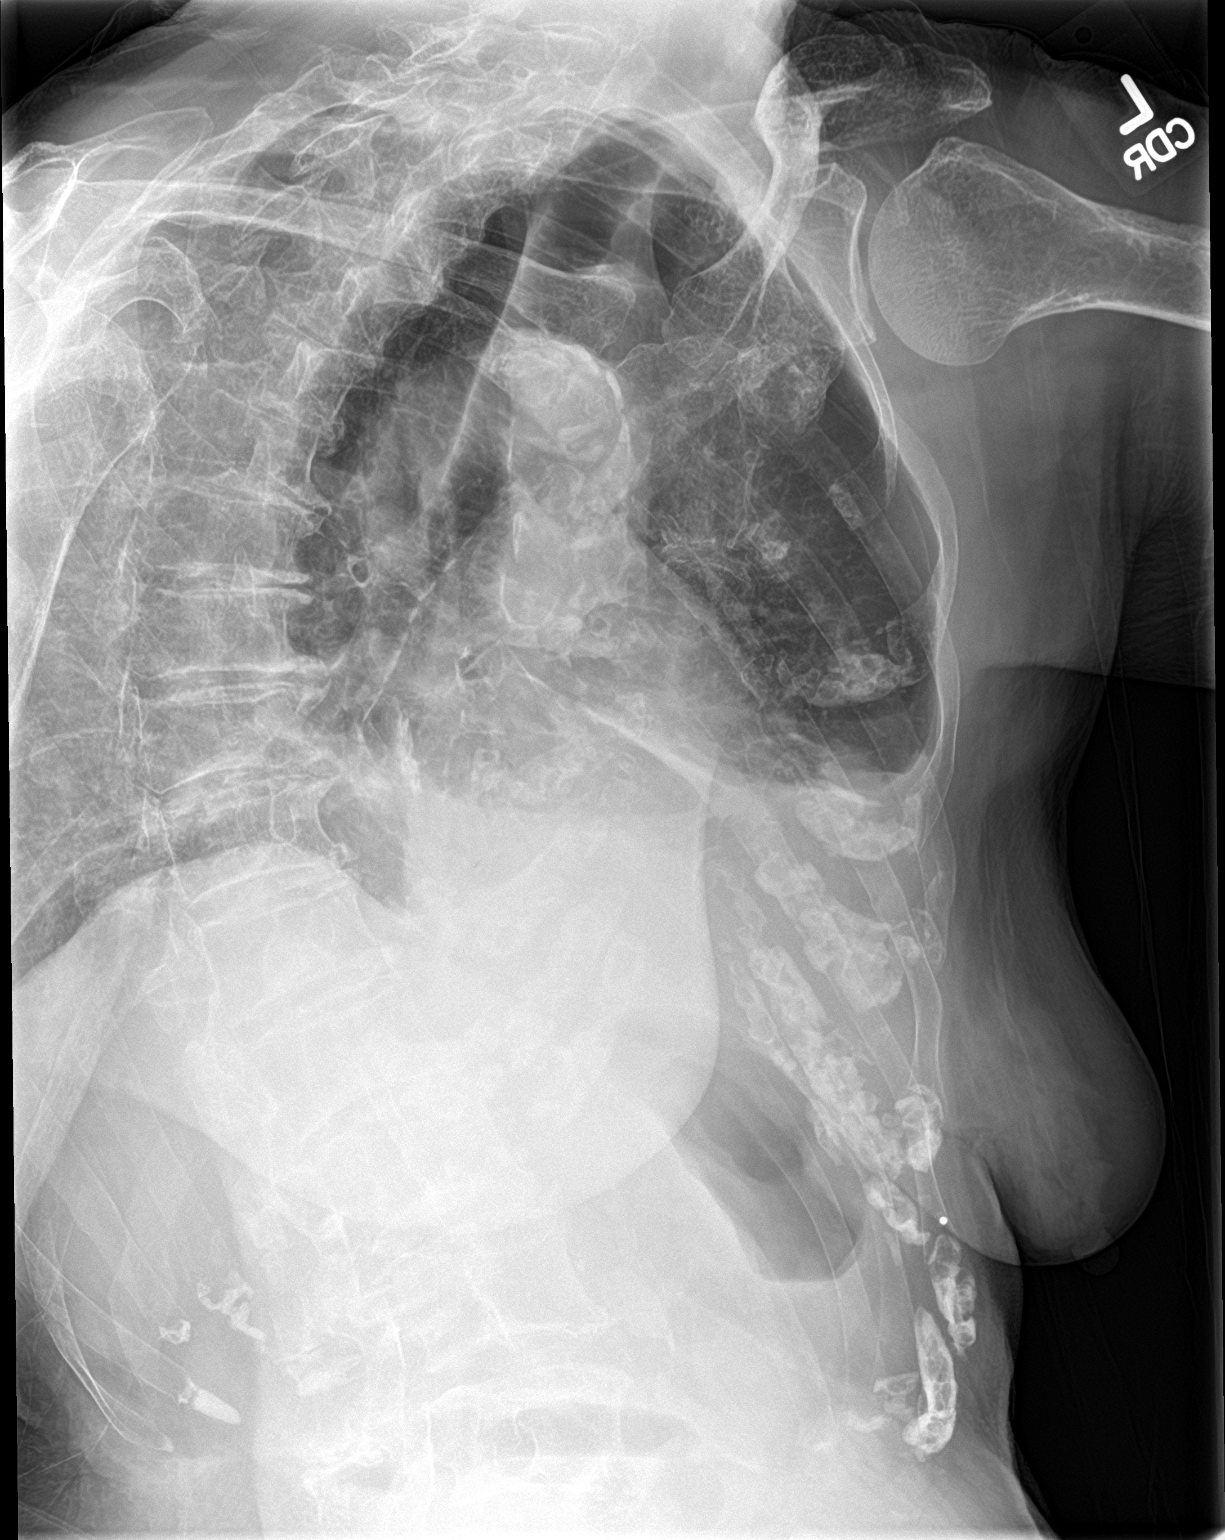

[3 of 3 positions shown; findings below may reference images not displayed]

FINDINGS: Acute fractures of the left posterior fifth, sixth, seventh, eighth
and ninth ribs noted.

There is a left hydro pneumothorax noted, approximately 15%
pneumothorax component atypically and a small to moderate
hydropneumothorax component at the base.

Left basilar atelectasis is noted.

The right lung is clear.

Cardiomediastinal silhouette is within normal limits.
IMPRESSION: Small to moderate left hydropneumothorax caused by acute fractures
of the left fifth through ninth ribs. Associated left basilar
atelectasis.

Critical Value/emergent results were called by telephone at the time
of interpretation on 12/24/2015 at [DATE] to Nelle Gumbs, PA, , who
verbally acknowledged these results.

## 2019-03-08 DEATH — deceased
